# Patient Record
Sex: Female | Born: 1935 | Race: White | Hispanic: No | Marital: Married | Smoking: Former smoker
Health system: Southern US, Community
[De-identification: ages and names within clinical notes are randomized; demographics above are authoritative.]

## PROBLEM LIST (undated history)

## (undated) DIAGNOSIS — E1140 Type 2 diabetes mellitus with diabetic neuropathy, unspecified: Secondary | ICD-10-CM

## (undated) DIAGNOSIS — G629 Polyneuropathy, unspecified: Secondary | ICD-10-CM

## (undated) DIAGNOSIS — I10 Essential (primary) hypertension: Secondary | ICD-10-CM

## (undated) DIAGNOSIS — S98119A Complete traumatic amputation of unspecified great toe, initial encounter: Secondary | ICD-10-CM

## (undated) DIAGNOSIS — J3089 Other allergic rhinitis: Secondary | ICD-10-CM

## (undated) DIAGNOSIS — R1310 Dysphagia, unspecified: Secondary | ICD-10-CM

## (undated) DIAGNOSIS — S98139A Complete traumatic amputation of one unspecified lesser toe, initial encounter: Secondary | ICD-10-CM

## (undated) DIAGNOSIS — R413 Other amnesia: Secondary | ICD-10-CM

## (undated) DIAGNOSIS — E785 Hyperlipidemia, unspecified: Secondary | ICD-10-CM

## (undated) DIAGNOSIS — F0390 Unspecified dementia without behavioral disturbance: Secondary | ICD-10-CM

## (undated) DIAGNOSIS — L089 Local infection of the skin and subcutaneous tissue, unspecified: Secondary | ICD-10-CM

## (undated) DIAGNOSIS — S91301A Unspecified open wound, right foot, initial encounter: Secondary | ICD-10-CM

## (undated) HISTORY — PX: TOTAL KNEE ARTHROPLASTY: SHX125

## (undated) HISTORY — DX: Hyperlipidemia, unspecified: E78.5

## (undated) HISTORY — DX: Local infection of the skin and subcutaneous tissue, unspecified: L08.9

## (undated) HISTORY — DX: Polyneuropathy, unspecified: G62.9

## (undated) HISTORY — PX: ELBOW FRACTURE SURGERY: SHX616

## (undated) HISTORY — DX: Unspecified open wound, right foot, initial encounter: S91.301A

## (undated) HISTORY — PX: APPENDECTOMY: SHX54

## (undated) HISTORY — DX: Other amnesia: R41.3

## (undated) HISTORY — DX: Essential (primary) hypertension: I10

## (undated) HISTORY — DX: Complete traumatic amputation of one unspecified lesser toe, initial encounter: S98.139A

## (undated) HISTORY — DX: Complete traumatic amputation of unspecified great toe, initial encounter: S98.119A

---

## 1997-12-12 ENCOUNTER — Inpatient Hospital Stay (HOSPITAL_COMMUNITY): Admission: RE | Admit: 1997-12-12 | Discharge: 1997-12-16 | Payer: Self-pay | Admitting: Specialist

## 1997-12-19 ENCOUNTER — Encounter (HOSPITAL_COMMUNITY): Admission: RE | Admit: 1997-12-19 | Discharge: 1998-03-19 | Payer: Self-pay | Admitting: Specialist

## 2000-06-27 ENCOUNTER — Encounter: Payer: Self-pay | Admitting: Internal Medicine

## 2000-11-02 ENCOUNTER — Other Ambulatory Visit: Admission: RE | Admit: 2000-11-02 | Discharge: 2000-11-02 | Payer: Self-pay | Admitting: Internal Medicine

## 2003-12-01 ENCOUNTER — Ambulatory Visit (HOSPITAL_COMMUNITY): Admission: RE | Admit: 2003-12-01 | Discharge: 2003-12-01 | Payer: Self-pay | Admitting: Orthopedic Surgery

## 2003-12-02 ENCOUNTER — Inpatient Hospital Stay (HOSPITAL_COMMUNITY): Admission: AD | Admit: 2003-12-02 | Discharge: 2003-12-08 | Payer: Self-pay | Admitting: Orthopedic Surgery

## 2003-12-03 ENCOUNTER — Encounter: Payer: Self-pay | Admitting: Cardiology

## 2003-12-08 ENCOUNTER — Inpatient Hospital Stay: Admission: RE | Admit: 2003-12-08 | Discharge: 2003-12-12 | Payer: Self-pay | Admitting: Internal Medicine

## 2005-01-06 ENCOUNTER — Ambulatory Visit: Payer: Self-pay | Admitting: Internal Medicine

## 2005-01-19 ENCOUNTER — Ambulatory Visit: Payer: Self-pay | Admitting: Internal Medicine

## 2005-04-13 ENCOUNTER — Ambulatory Visit: Payer: Self-pay | Admitting: Internal Medicine

## 2005-08-12 ENCOUNTER — Ambulatory Visit: Payer: Self-pay | Admitting: Internal Medicine

## 2005-08-19 ENCOUNTER — Ambulatory Visit: Payer: Self-pay | Admitting: Internal Medicine

## 2005-11-16 ENCOUNTER — Ambulatory Visit: Payer: Self-pay | Admitting: Internal Medicine

## 2005-11-29 ENCOUNTER — Ambulatory Visit: Payer: Self-pay | Admitting: Internal Medicine

## 2006-01-31 ENCOUNTER — Ambulatory Visit: Payer: Self-pay | Admitting: Internal Medicine

## 2006-05-17 ENCOUNTER — Ambulatory Visit: Payer: Self-pay | Admitting: Internal Medicine

## 2006-05-17 LAB — CONVERTED CEMR LAB
CO2: 26 meq/L (ref 19–32)
GFR calc Af Amer: 56 mL/min
GFR calc non Af Amer: 46 mL/min
Microalb, Ur: 0.7 mg/dL (ref 0.0–1.9)
Potassium: 4.1 meq/L (ref 3.5–5.1)
Sodium: 137 meq/L (ref 135–145)

## 2006-05-24 ENCOUNTER — Ambulatory Visit: Payer: Self-pay | Admitting: Internal Medicine

## 2006-08-22 ENCOUNTER — Encounter: Payer: Self-pay | Admitting: Internal Medicine

## 2006-08-22 DIAGNOSIS — E785 Hyperlipidemia, unspecified: Secondary | ICD-10-CM | POA: Insufficient documentation

## 2006-08-22 DIAGNOSIS — IMO0002 Reserved for concepts with insufficient information to code with codable children: Secondary | ICD-10-CM | POA: Insufficient documentation

## 2006-08-22 DIAGNOSIS — I10 Essential (primary) hypertension: Secondary | ICD-10-CM | POA: Insufficient documentation

## 2006-08-22 DIAGNOSIS — E119 Type 2 diabetes mellitus without complications: Secondary | ICD-10-CM

## 2006-08-22 DIAGNOSIS — G609 Hereditary and idiopathic neuropathy, unspecified: Secondary | ICD-10-CM | POA: Insufficient documentation

## 2006-08-22 DIAGNOSIS — Z96659 Presence of unspecified artificial knee joint: Secondary | ICD-10-CM

## 2006-08-22 DIAGNOSIS — M5030 Other cervical disc degeneration, unspecified cervical region: Secondary | ICD-10-CM

## 2006-09-20 ENCOUNTER — Ambulatory Visit: Payer: Self-pay | Admitting: Internal Medicine

## 2006-09-20 LAB — CONVERTED CEMR LAB
ALT: 31 units/L (ref 0–40)
Direct LDL: 77.8 mg/dL
Triglycerides: 221 mg/dL (ref 0–149)
VLDL: 44 mg/dL — ABNORMAL HIGH (ref 0–40)

## 2006-10-03 ENCOUNTER — Ambulatory Visit: Payer: Self-pay | Admitting: Internal Medicine

## 2007-01-23 ENCOUNTER — Inpatient Hospital Stay (HOSPITAL_COMMUNITY): Admission: EM | Admit: 2007-01-23 | Discharge: 2007-01-25 | Payer: Self-pay | Admitting: Emergency Medicine

## 2007-01-25 ENCOUNTER — Ambulatory Visit: Payer: Self-pay | Admitting: Internal Medicine

## 2007-01-25 ENCOUNTER — Telehealth: Payer: Self-pay | Admitting: Internal Medicine

## 2007-01-31 ENCOUNTER — Ambulatory Visit: Payer: Self-pay | Admitting: Internal Medicine

## 2007-01-31 DIAGNOSIS — L84 Corns and callosities: Secondary | ICD-10-CM

## 2007-01-31 DIAGNOSIS — M21619 Bunion of unspecified foot: Secondary | ICD-10-CM

## 2007-01-31 DIAGNOSIS — L03119 Cellulitis of unspecified part of limb: Secondary | ICD-10-CM

## 2007-01-31 DIAGNOSIS — L02619 Cutaneous abscess of unspecified foot: Secondary | ICD-10-CM

## 2007-02-20 ENCOUNTER — Ambulatory Visit: Payer: Self-pay | Admitting: Internal Medicine

## 2007-03-19 ENCOUNTER — Telehealth (INDEPENDENT_AMBULATORY_CARE_PROVIDER_SITE_OTHER): Payer: Self-pay | Admitting: *Deleted

## 2007-04-12 HISTORY — PX: AMPUTATION: SHX166

## 2007-04-23 ENCOUNTER — Ambulatory Visit: Payer: Self-pay | Admitting: Internal Medicine

## 2007-04-24 ENCOUNTER — Encounter: Payer: Self-pay | Admitting: Internal Medicine

## 2007-04-24 LAB — CONVERTED CEMR LAB
Calcium, Total (PTH): 10 mg/dL (ref 8.4–10.5)
Vit D, 1,25-Dihydroxy: 31 (ref 30–89)

## 2007-04-30 ENCOUNTER — Ambulatory Visit: Payer: Self-pay | Admitting: Internal Medicine

## 2007-08-29 ENCOUNTER — Ambulatory Visit: Payer: Self-pay | Admitting: Internal Medicine

## 2007-09-03 LAB — CONVERTED CEMR LAB
BUN: 19 mg/dL (ref 6–23)
Calcium: 9.8 mg/dL (ref 8.4–10.5)
Creatinine,U: 277.6 mg/dL
GFR calc non Af Amer: 51 mL/min
Glucose, Bld: 109 mg/dL — ABNORMAL HIGH (ref 70–99)
Hgb A1c MFr Bld: 6.4 % — ABNORMAL HIGH (ref 4.6–6.0)
Microalb Creat Ratio: 4.7 mg/g (ref 0.0–30.0)
Potassium: 4.4 meq/L (ref 3.5–5.1)
Sodium: 139 meq/L (ref 135–145)
Total CHOL/HDL Ratio: 3.3

## 2007-09-05 ENCOUNTER — Ambulatory Visit: Payer: Self-pay | Admitting: Internal Medicine

## 2007-09-05 DIAGNOSIS — E1149 Type 2 diabetes mellitus with other diabetic neurological complication: Secondary | ICD-10-CM

## 2007-09-11 ENCOUNTER — Ambulatory Visit: Payer: Self-pay | Admitting: Internal Medicine

## 2007-09-17 LAB — CONVERTED CEMR LAB
Leukocytes, UA: NEGATIVE
Nitrite: NEGATIVE
Specific Gravity, Urine: 1.01 (ref 1.000–1.03)
Urobilinogen, UA: 0.2 (ref 0.0–1.0)

## 2007-10-30 ENCOUNTER — Telehealth (INDEPENDENT_AMBULATORY_CARE_PROVIDER_SITE_OTHER): Payer: Self-pay | Admitting: *Deleted

## 2007-12-26 ENCOUNTER — Ambulatory Visit: Payer: Self-pay | Admitting: Internal Medicine

## 2007-12-26 LAB — CONVERTED CEMR LAB: Hgb A1c MFr Bld: 6.3 % — ABNORMAL HIGH (ref 4.6–6.0)

## 2008-01-02 ENCOUNTER — Ambulatory Visit: Payer: Self-pay | Admitting: Internal Medicine

## 2008-03-20 ENCOUNTER — Telehealth: Payer: Self-pay | Admitting: Internal Medicine

## 2008-03-31 ENCOUNTER — Ambulatory Visit: Payer: Self-pay | Admitting: Internal Medicine

## 2008-03-31 ENCOUNTER — Inpatient Hospital Stay (HOSPITAL_COMMUNITY): Admission: EM | Admit: 2008-03-31 | Discharge: 2008-04-03 | Payer: Self-pay | Admitting: Emergency Medicine

## 2008-04-02 ENCOUNTER — Encounter: Payer: Self-pay | Admitting: Internal Medicine

## 2008-04-08 ENCOUNTER — Encounter: Payer: Self-pay | Admitting: Internal Medicine

## 2008-04-21 ENCOUNTER — Encounter: Payer: Self-pay | Admitting: Internal Medicine

## 2008-04-23 ENCOUNTER — Ambulatory Visit: Payer: Self-pay | Admitting: Internal Medicine

## 2008-05-07 ENCOUNTER — Ambulatory Visit: Payer: Self-pay | Admitting: Internal Medicine

## 2008-05-07 DIAGNOSIS — S98139A Complete traumatic amputation of one unspecified lesser toe, initial encounter: Secondary | ICD-10-CM | POA: Insufficient documentation

## 2008-05-07 HISTORY — DX: Complete traumatic amputation of one unspecified lesser toe, initial encounter: S98.139A

## 2008-06-17 ENCOUNTER — Ambulatory Visit: Payer: Self-pay | Admitting: Internal Medicine

## 2008-06-17 ENCOUNTER — Telehealth: Payer: Self-pay | Admitting: Internal Medicine

## 2008-06-17 ENCOUNTER — Inpatient Hospital Stay (HOSPITAL_COMMUNITY): Admission: AD | Admit: 2008-06-17 | Discharge: 2008-06-23 | Payer: Self-pay | Admitting: Internal Medicine

## 2008-06-17 DIAGNOSIS — E1169 Type 2 diabetes mellitus with other specified complication: Secondary | ICD-10-CM | POA: Insufficient documentation

## 2008-06-18 ENCOUNTER — Encounter (INDEPENDENT_AMBULATORY_CARE_PROVIDER_SITE_OTHER): Payer: Self-pay | Admitting: Orthopedic Surgery

## 2008-06-18 ENCOUNTER — Ambulatory Visit: Payer: Self-pay | Admitting: Vascular Surgery

## 2008-06-20 ENCOUNTER — Encounter (INDEPENDENT_AMBULATORY_CARE_PROVIDER_SITE_OTHER): Payer: Self-pay | Admitting: Orthopedic Surgery

## 2008-06-23 ENCOUNTER — Encounter: Payer: Self-pay | Admitting: Internal Medicine

## 2008-06-23 HISTORY — PX: AMPUTATION: SHX166

## 2008-06-30 ENCOUNTER — Ambulatory Visit: Payer: Self-pay | Admitting: Internal Medicine

## 2008-06-30 DIAGNOSIS — S98119A Complete traumatic amputation of unspecified great toe, initial encounter: Secondary | ICD-10-CM

## 2008-06-30 DIAGNOSIS — D649 Anemia, unspecified: Secondary | ICD-10-CM | POA: Insufficient documentation

## 2008-06-30 HISTORY — DX: Complete traumatic amputation of unspecified great toe, initial encounter: S98.119A

## 2008-07-24 ENCOUNTER — Encounter: Payer: Self-pay | Admitting: Internal Medicine

## 2008-07-30 ENCOUNTER — Ambulatory Visit: Payer: Self-pay | Admitting: Internal Medicine

## 2008-07-30 LAB — CONVERTED CEMR LAB
Basophils Absolute: 0 10*3/uL (ref 0.0–0.1)
Basophils Relative: 0.7 % (ref 0.0–3.0)
Calcium: 9.2 mg/dL (ref 8.4–10.5)
Chloride: 105 meq/L (ref 96–112)
Creatinine, Ser: 0.8 mg/dL (ref 0.4–1.2)
GFR calc non Af Amer: 73.54 mL/min (ref >60)
Hemoglobin: 12.1 g/dL (ref 12.0–15.0)
Hgb A1c MFr Bld: 6.6 % — ABNORMAL HIGH (ref 4.6–6.5)
Lymphocytes Relative: 36.1 % (ref 12.0–46.0)
MCV: 90.3 fL (ref 78.0–100.0)
Monocytes Absolute: 0.3 10*3/uL (ref 0.1–1.0)
Monocytes Relative: 5.7 % (ref 3.0–12.0)
Neutro Abs: 3.3 10*3/uL (ref 1.4–7.7)
Neutrophils Relative %: 55.7 % (ref 43.0–77.0)
RDW: 14.7 % — ABNORMAL HIGH (ref 11.5–14.6)
WBC: 5.8 10*3/uL (ref 4.5–10.5)

## 2008-08-06 ENCOUNTER — Ambulatory Visit: Payer: Self-pay | Admitting: Internal Medicine

## 2008-10-28 ENCOUNTER — Ambulatory Visit: Payer: Self-pay | Admitting: Internal Medicine

## 2008-10-28 LAB — CONVERTED CEMR LAB
BUN: 21 mg/dL (ref 6–23)
Chloride: 106 meq/L (ref 96–112)
Cholesterol: 138 mg/dL (ref 0–200)
Creatinine, Ser: 1 mg/dL (ref 0.4–1.2)
LDL Cholesterol: 67 mg/dL (ref 0–99)
Total CHOL/HDL Ratio: 3

## 2008-12-02 ENCOUNTER — Ambulatory Visit: Payer: Self-pay | Admitting: Internal Medicine

## 2008-12-02 DIAGNOSIS — R5381 Other malaise: Secondary | ICD-10-CM | POA: Insufficient documentation

## 2008-12-02 DIAGNOSIS — R5383 Other fatigue: Secondary | ICD-10-CM

## 2008-12-02 LAB — CONVERTED CEMR LAB: Blood Glucose, Fingerstick: 220

## 2008-12-10 LAB — HM DIABETES EYE EXAM: HM Diabetic Eye Exam: NORMAL

## 2009-02-23 ENCOUNTER — Ambulatory Visit: Payer: Self-pay | Admitting: Internal Medicine

## 2009-02-23 LAB — CONVERTED CEMR LAB: Hgb A1c MFr Bld: 6.2 % (ref 4.6–6.5)

## 2009-03-02 ENCOUNTER — Ambulatory Visit: Payer: Self-pay | Admitting: Internal Medicine

## 2009-03-03 LAB — CONVERTED CEMR LAB
Creatinine,U: 174.8 mg/dL
Microalb Creat Ratio: 3.4 mg/g (ref 0.0–30.0)
Microalb, Ur: 0.6 mg/dL (ref 0.0–1.9)

## 2009-05-23 IMAGING — CR DG FOOT COMPLETE 3+V*R*
3 series · 3 of 3 positions shown · non-contrast
Comparison: MRI 01/24/2007.

CLINICAL DATA: Infected sore right big toe.

RIGHT FOOT COMPLETE - 3+ VIEW

[view not recorded (1 of 3)]
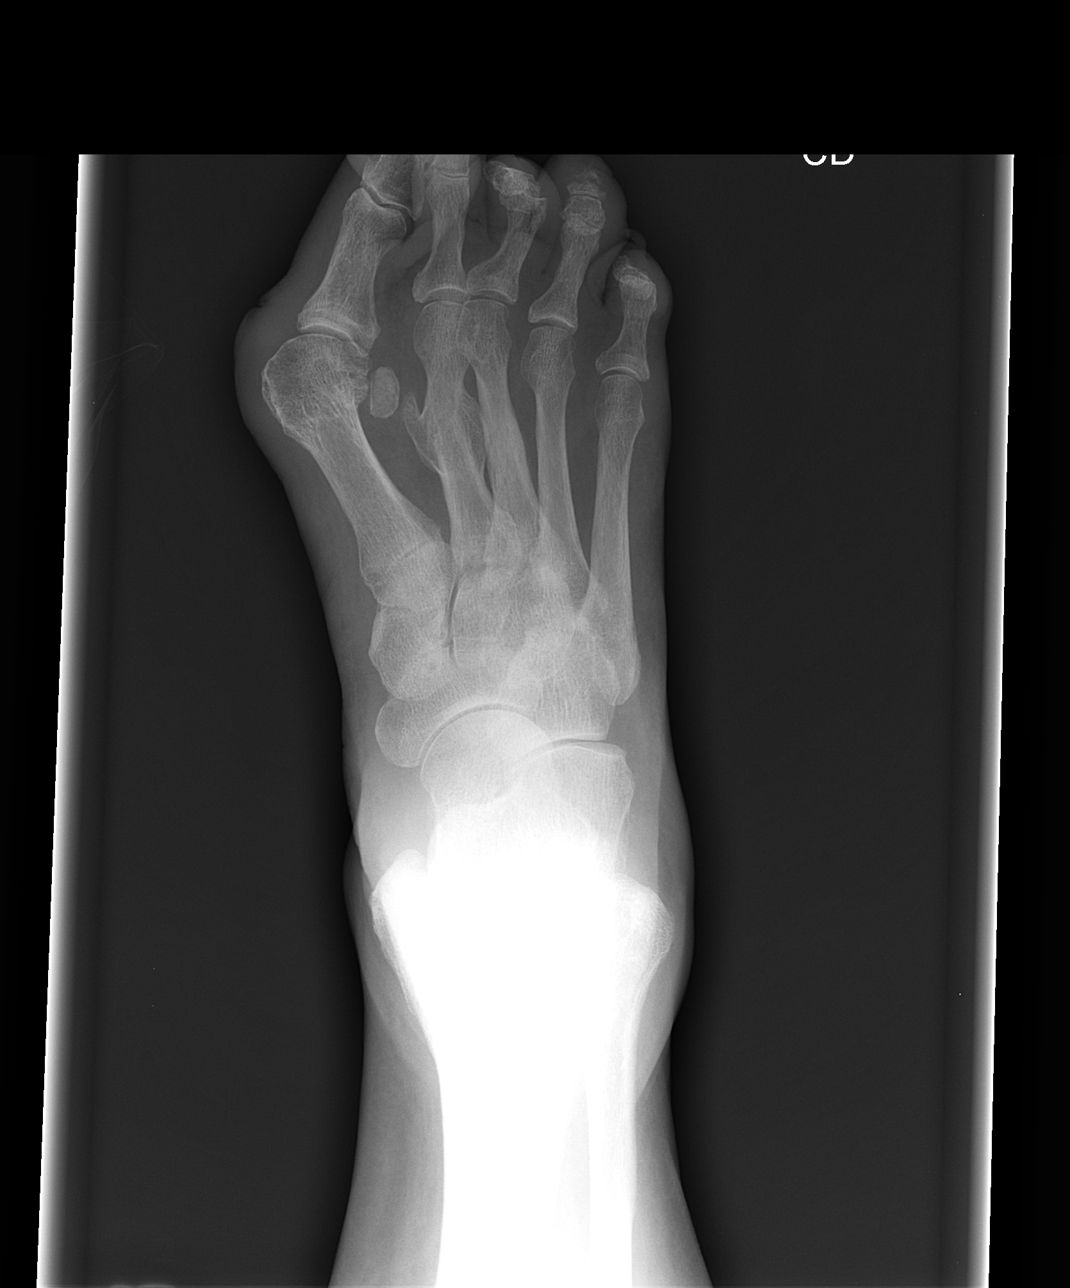

[view not recorded (2 of 3)]
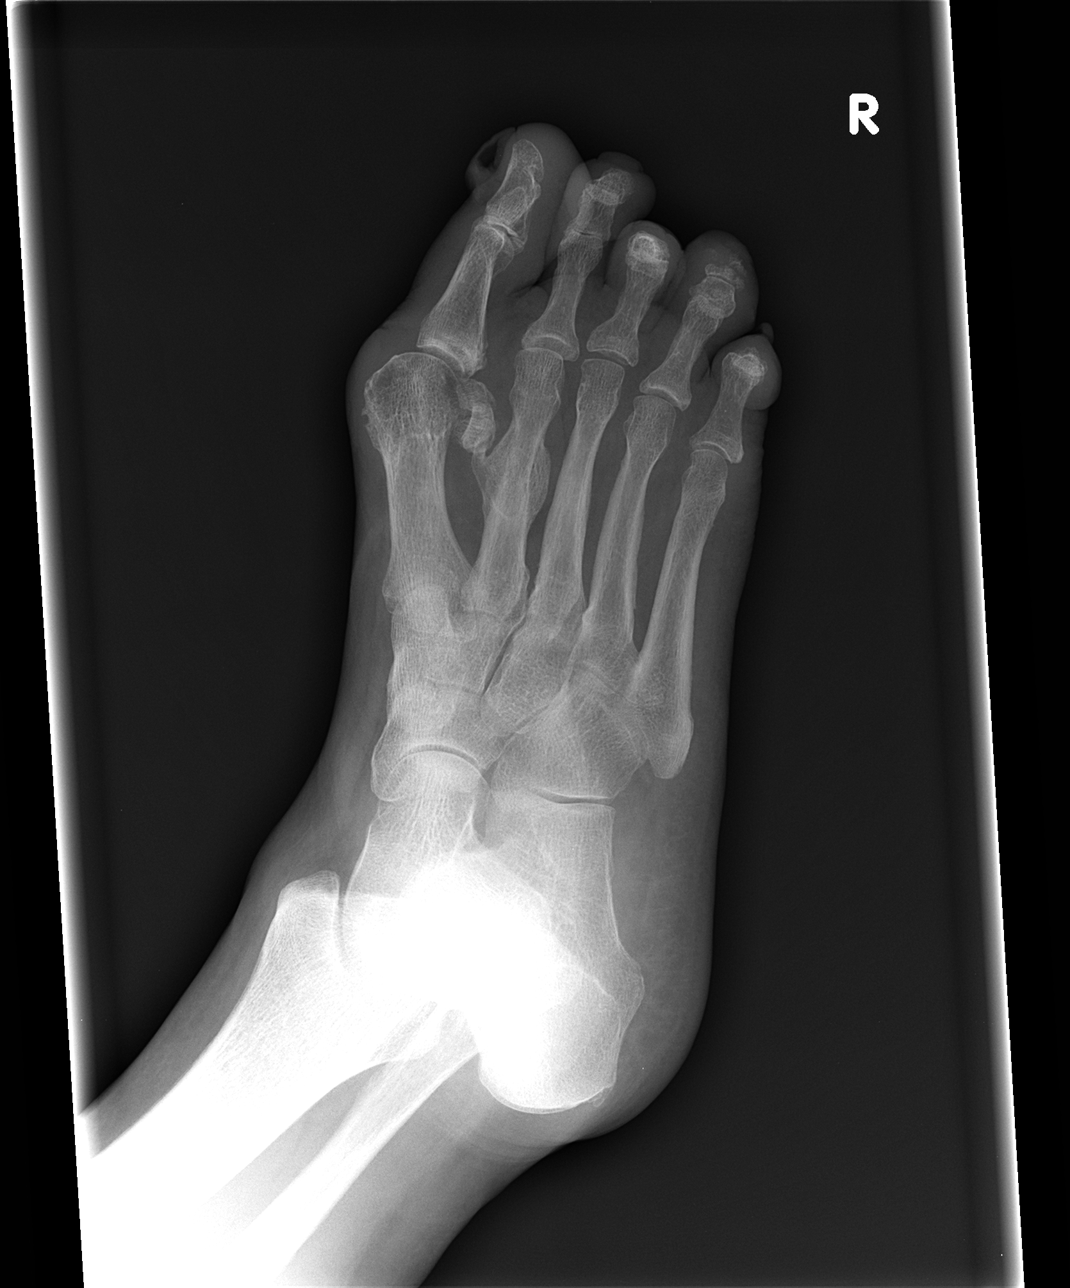

[view not recorded (3 of 3)]
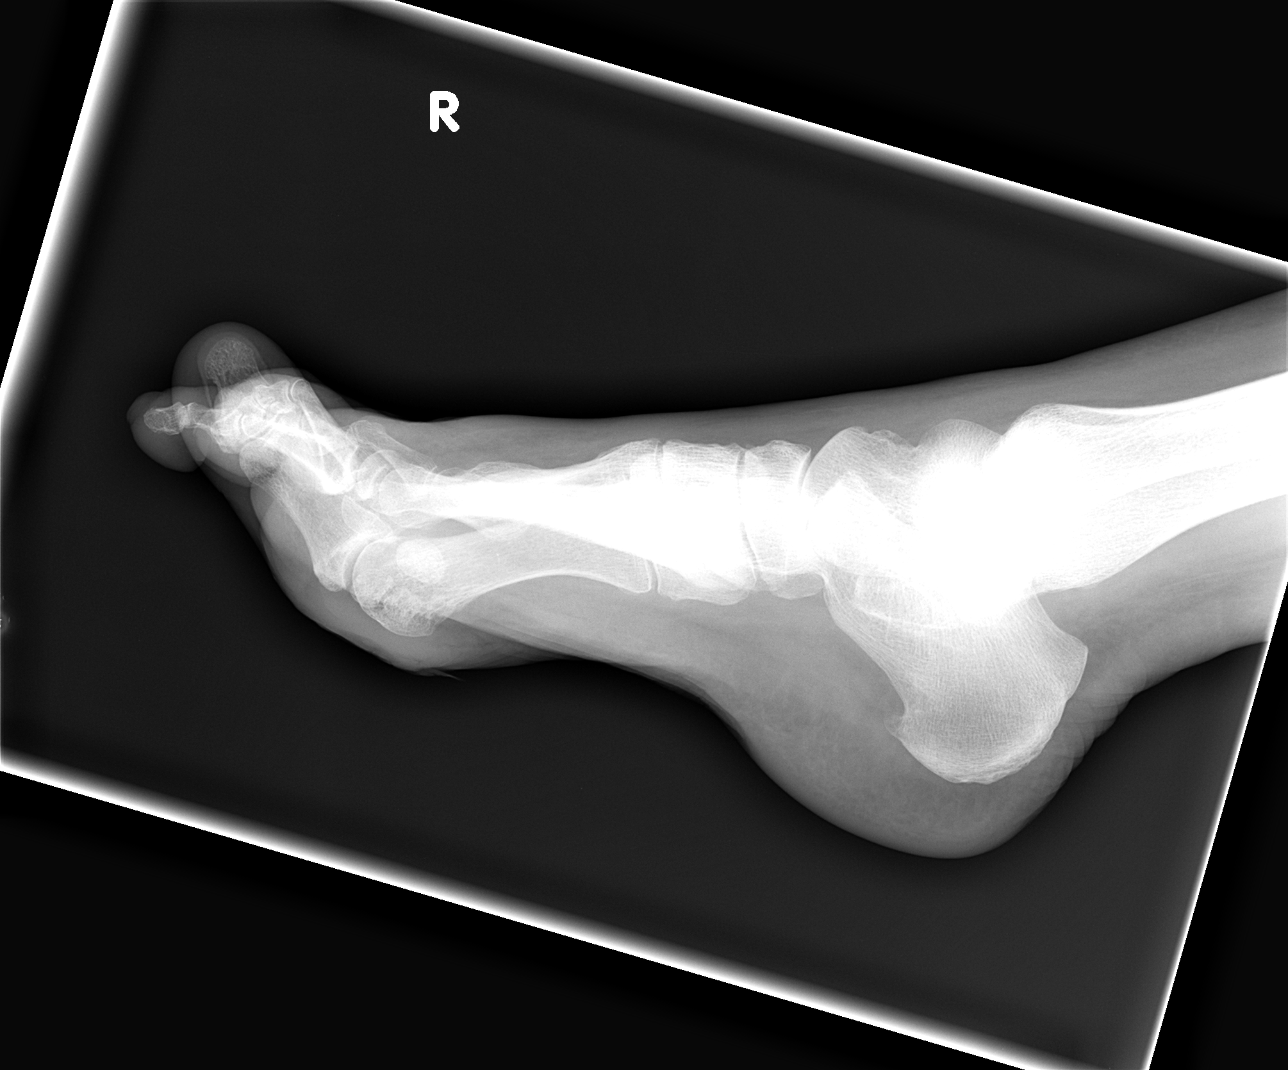

[3 of 3 positions shown; findings below may reference images not displayed]

FINDINGS: A severe hallux valgus deformity is present.  There is
irregular callus formation about the midbody of the second
metatarsal compatible with remote healed fracture.  There is soft
tissue swelling with ulceration at the level of the first MTP
joint.  There is no underlying bone destruction.  There does appear
be some bone destruction in the distal phalanx of the fourth toe.
There may be some soft tissue swelling there is well.  No other
acute osseous abnormality is seen.
IMPRESSION: 1.  Severe hallux valgus deformity with rotation at the MTP joint.
2.  Soft tissue swelling and probable ulceration without definite
osseous destruction.
3.  Apparent bone destruction in the distal phalanx of the fourth
toe.  There was no focal process involving this toe on the previous
MRI scan.

## 2009-06-17 ENCOUNTER — Ambulatory Visit: Payer: Self-pay | Admitting: Internal Medicine

## 2009-06-17 LAB — CONVERTED CEMR LAB: Hgb A1c MFr Bld: 6.4 % (ref 4.6–6.5)

## 2009-06-30 ENCOUNTER — Ambulatory Visit: Payer: Self-pay | Admitting: Internal Medicine

## 2009-08-20 ENCOUNTER — Encounter: Payer: Self-pay | Admitting: Internal Medicine

## 2009-09-16 ENCOUNTER — Ambulatory Visit: Payer: Self-pay | Admitting: Internal Medicine

## 2009-11-18 ENCOUNTER — Ambulatory Visit: Payer: Self-pay | Admitting: Internal Medicine

## 2009-11-18 LAB — CONVERTED CEMR LAB
AST: 20 units/L (ref 0–37)
BUN: 22 mg/dL (ref 6–23)
Bilirubin, Direct: 0.2 mg/dL (ref 0.0–0.3)
CO2: 26 meq/L (ref 19–32)
Chloride: 106 meq/L (ref 96–112)
Eosinophils Relative: 1 % (ref 0.0–5.0)
GFR calc non Af Amer: 52.4 mL/min (ref >60)
Glucose, Bld: 136 mg/dL — ABNORMAL HIGH (ref 70–99)
HCT: 39.8 % (ref 36.0–46.0)
Lymphocytes Relative: 21.5 % (ref 12.0–46.0)
MCV: 93.4 fL (ref 78.0–100.0)
Monocytes Relative: 3.9 % (ref 3.0–12.0)
Neutro Abs: 5.3 10*3/uL (ref 1.4–7.7)
Neutrophils Relative %: 73.2 % (ref 43.0–77.0)
Platelets: 254 10*3/uL (ref 150.0–400.0)
Potassium: 4.9 meq/L (ref 3.5–5.1)
RBC: 4.26 M/uL (ref 3.87–5.11)
TSH: 1.34 microintl units/mL (ref 0.35–5.50)
Total CHOL/HDL Ratio: 3
Total Protein: 7.1 g/dL (ref 6.0–8.3)
Triglycerides: 150 mg/dL — ABNORMAL HIGH (ref 0.0–149.0)
VLDL: 30 mg/dL (ref 0.0–40.0)
Vitamin B-12: 545 pg/mL (ref 211–911)

## 2009-11-26 ENCOUNTER — Ambulatory Visit: Payer: Self-pay | Admitting: Internal Medicine

## 2009-11-26 DIAGNOSIS — R269 Unspecified abnormalities of gait and mobility: Secondary | ICD-10-CM | POA: Insufficient documentation

## 2010-01-12 ENCOUNTER — Encounter: Payer: Self-pay | Admitting: Internal Medicine

## 2010-01-13 ENCOUNTER — Encounter: Payer: Self-pay | Admitting: Internal Medicine

## 2010-01-28 ENCOUNTER — Ambulatory Visit: Payer: Self-pay | Admitting: Internal Medicine

## 2010-02-02 ENCOUNTER — Encounter: Payer: Self-pay | Admitting: Internal Medicine

## 2010-03-15 ENCOUNTER — Encounter: Payer: Self-pay | Admitting: Internal Medicine

## 2010-03-18 ENCOUNTER — Ambulatory Visit: Payer: Self-pay | Admitting: Internal Medicine

## 2010-04-09 ENCOUNTER — Ambulatory Visit: Payer: Self-pay | Admitting: Internal Medicine

## 2010-04-09 DIAGNOSIS — L089 Local infection of the skin and subcutaneous tissue, unspecified: Secondary | ICD-10-CM | POA: Insufficient documentation

## 2010-04-09 DIAGNOSIS — L97509 Non-pressure chronic ulcer of other part of unspecified foot with unspecified severity: Secondary | ICD-10-CM

## 2010-04-11 HISTORY — PX: TOE AMPUTATION: SHX809

## 2010-04-13 ENCOUNTER — Encounter: Payer: Self-pay | Admitting: Internal Medicine

## 2010-04-14 ENCOUNTER — Encounter: Payer: Self-pay | Admitting: Internal Medicine

## 2010-04-14 ENCOUNTER — Encounter: Payer: Self-pay | Admitting: *Deleted

## 2010-05-11 NOTE — Letter (Signed)
Summary: [facility]  [facility]   Imported By: Maryln Gottron 02/01/2010 15:56:45  _____________________________________________________________________  External Attachment:    Type:   Image     Comment:   External Document

## 2010-05-11 NOTE — Letter (Signed)
Summary: Request for Surgical Clearance/[facility]  Request for Surgical Clearance/[facility]   Imported By: Maryln Gottron 02/01/2010 15:55:47  _____________________________________________________________________  External Attachment:    Type:   Image     Comment:   External Document

## 2010-05-11 NOTE — Letter (Signed)
Summary: [facility]  [facility]   Imported By: Maryln Gottron 08/28/2009 10:55:53  _____________________________________________________________________  External Attachment:    Type:   Image     Comment:   External Document

## 2010-05-11 NOTE — Assessment & Plan Note (Signed)
Summary: FORM COMPLETION (VETERAN BENEFIT FORM) // RS   Vital Signs:  Patient profile:   77 year old female Menstrual status:  postmenopausal Weight:      159 pounds Pulse rate:   100 / minute BP sitting:   110 / 60  (left arm) Cuff size:   regular  Vitals Entered By: Romualdo Bolk, CMA (AAMA) (September 16, 2009 2:55 PM) CC: Form Completion   History of Present Illness: Natasha West comes in today  for return office visit .for number of medical problems.  She also has a form for documentation  of her functional ability and limitations form the [place] .  She has seen neurology for  blance problem and dx as perpheral neuropathy  .   No ingervention except physical supports  although considering PT if needed.   Risk of falling  but has had " no serious falls"and now using  walker  roller seat. Dos quite well with this. Blood sugars up and down.    no lows BP doing well. No new infections vision change or cp sob.   Preventive Screening-Counseling & Management  Alcohol-Tobacco     Alcohol drinks/day: 0     Smoking Status: quit     Year Quit: 25 years ago  Caffeine-Diet-Exercise     Caffeine use/day: 1-2 a week     Diet Counseling: to improve diet; diet is suboptimal     Nutrition Referrals: no     Does Patient Exercise: no  Current Medications (verified): 1)  Amaryl 4 Mg Tabs (Glimepiride) .... Take 1 Tablet By Mouth Twice A Day 2)  Aspir-81 81 Mg Tbec (Aspirin) .... Take 1 Tablet By Mouth Every Morning 3)  Hydrocodone-Acetaminophen 5-500 Mg Tabs (Hydrocodone-Acetaminophen) 4)  Lipitor 10 Mg Tabs (Atorvastatin Calcium) .... Take 1 Tablet By Mouth Once A Day 5)  Lisinopril-Hydrochlorothiazide 20-12.5 Mg Tabs (Lisinopril-Hydrochlorothiazide) .... Take 1 Tablet By Mouth Once A Day 6)  Metformin Hcl 500 Mg Tb24 (Metformin Hcl) .... Take 1 Two Times A Day 7)  Sm Calcium/vitamin D 500-200 Mg-Unit Tabs (Calcium Carbonate-Vitamin D) .... Take 2 Tablet By Mouth Once A Day 8)  Vitamin C  1000 Mg Tabs (Ascorbic Acid) .... Take 9)  Vitamin B-12 Cr 1500 Mcg  Tbcr (Cyanocobalamin) .[name].. 1 By Mouth Once Daily  Allergies (verified): 1)  Penicillin  Past History:  Past medical, surgical, family and social histories (including risk factors) reviewed, and no changes noted (except as noted below).  Past Medical History: Reviewed history from 05/07/2008 and no changes required. Diabetes mellitus, type II with neuropathy Hyperlipidemia Hypertension Peripheral neuropathy      Past Surgical History: Reviewed history from 06/30/2008 and no changes required. 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 Charlsie Merles Neurology: [facility] Surgery: Dr. Luiz Blare  Family History: Reviewed history from 06/30/2009 and no changes required. [place] Family History Diabetes 1st degree relative daughter recently dxed  Mom: allzheiners father cva endarterectomy  paternal aunt ovarian ca  paternal uncles heart and stroke       Social History: Reviewed history from 06/17/2008 and no changes required. Former Smoker Married husband has vision problem and recurrent falls   moved to retirement place   [employer].   2010    closer to family.  house still not sold  Review of Systems  The patient denies anorexia, fever, weight gain, vision loss, decreased hearing, hoarseness, chest pain, syncope, dyspnea on exertion, peripheral edema, prolonged cough, abdominal pain, melena, hematochezia, severe indigestion/heartburn, hematuria, transient blindness, depression, abnormal bleeding, enlarged lymph nodes, and angioedema.         somem unintentional weight loss because of  food plan but doing well  not from illness, nl bowel and bladder function  Physical Exam  General:  alert, well-developed,  and well-nourished.   in nad  requiring roller walker  to maneuver  is very unsteady without holding on  even at rest. Head:  normocephalic and atraumatic.   Eyes:  vision grossly intact and pupils equal.   Ears:  R ear normal and L ear normal.   Neck:  No deformities, masses, or tenderness noted. no brits hear Lungs:  Normal respiratory effort, chest expands symmetrically. Lungs are clear to auscultation, no crackles or wheezes. Heart:  Normal rate and regular rhythm. S1 and S2 normal without gallop, murmur, click, rub or other extra sounds. Pulses:  pulses intact without delay  ue  Extremities:  nl capa refill  Neurologic:  UE grip good left elbow sithout full extension  but good rom otherwise gait  is abnormal with ? paretic gait on right  aiwth limp and usnteady unless with assistance   no cogwheeling   a and oriented x 3  Skin:  turgor normal, color normal, no ecchymoses, no petechiae, and no purpura.   Cervical Nodes:  No lymphadenopathy noted Psych:  Oriented X3, normally interactive, good eye contact, not anxious appearing, and not depressed appearing.     Impression & Recommendations:  Problem # 1:  PERIPHERAL NEUROPATHY (ICD-356.9) problematic but no falling at present . see neuro consult.  needs rollerwalker to  be mobile .   Problem # 2:  DIABETES MELLITUS, TYPE II, WITH NEUROLOGICAL COMPLICATIONS (ICD-250.60)  Her updated medication list for this problem includes:    Amaryl 4 Mg Tabs (Glimepiride) .[name]... Take 1 tablet by mouth twice a day    Aspir-81 81 Mg Tbec (Aspirin) .[name]... Take 1 tablet by mouth every morning    Lisinopril-hydrochlorothiazide 20-12.5 Mg Tabs (Lisinopril-hydrochlorothiazide) .[name]... Take 1 tablet by mouth once a day    Metformin Hcl 500 Mg Tb24 (Metformin hcl) .[name]... Take 1 two times a day  Labs Reviewed: Creat: 1.0 (10/28/2008)     Last Eye Exam: normal (12/10/2008) Reviewed HgBA1c results: 6.4 (06/17/2009)  6.2 (02/23/2009)  Problem # 3:   HYPERTENSION (ICD-401.9)  Her updated medication list for this problem includes:    Lisinopril-hydrochlorothiazide 20-12.5 Mg Tabs (Lisinopril-hydrochlorothiazide) .[name]... Take 1 tablet by mouth once a day  Problem # 4:  HYPERLIPIDEMIA (ICD-272.4)  Her updated medication list for this problem includes:    Lipitor 10 Mg Tabs (Atorvastatin calcium) .[name]... Take 1 tablet by mouth once a day  Problem # 5:  DEGENERATION, DISC NOS (ICD-722.6) Assessment: Comment Only  Complete Medication List: 1)  Amaryl 4 Mg Tabs (Glimepiride) .... Take 1 tablet by mouth twice a day 2)  Aspir-81 81 Mg Tbec (Aspirin) .... Take 1 tablet by mouth every morning 3)  Hydrocodone-acetaminophen 5-500 Mg Tabs (Hydrocodone-acetaminophen) 4)  Lipitor 10 Mg Tabs (Atorvastatin calcium) .... Take 1 tablet by mouth once a day 5)  Lisinopril-hydrochlorothiazide 20-12.5 Mg Tabs (Lisinopril-hydrochlorothiazide) .... Take 1 tablet by mouth once a day 6)  Metformin Hcl 500 Mg Tb24 (Metformin hcl) .... Take 1 two times a day 7)  Sm Calcium/vitamin D  500-200 Mg-unit Tabs (Calcium carbonate-vitamin d) .... Take 2 tablet by mouth once a day 8)  Vitamin C 1000 Mg Tabs (Ascorbic acid) .... Take 9)  Vitamin B-12 Cr 1500 Mcg Tbcr (Cyanocobalamin) .[name].. 1 by mouth once daily  Patient Instructions: 1)  keep lab and follow up appt in the summer. 2)  consider  calling Dr Anne Hahn and see if physical therapy may help your balance .

## 2010-05-11 NOTE — Letter (Signed)
Summary: Surgery Request  Surgery Request   Imported By: Georgian Co 02/02/2010 12:20:49  _____________________________________________________________________  External Attachment:    Type:   Image     Comment:   External Document

## 2010-05-11 NOTE — Consult Note (Signed)
Summary: [facility]   Imported By: Maryln Gottron 03/18/2010 12:25:48  _____________________________________________________________________  External Attachment:    Type:   Image     Comment:   External Document

## 2010-05-11 NOTE — Assessment & Plan Note (Signed)
Summary: 4 MONTH ROA//LH   Vital Signs:  Patient profile:   77 year old female Menstrual status:  postmenopausal Height:      66 inches Weight:      162 pounds BMI:     26.24 Pulse rate:   120 / minute BP sitting:   110 / 60  (right arm) Cuff size:   regular  Vitals Entered By: Romualdo Bolk, CMA (AAMA) (June 30, 2009 10:58 AM) CC: Follow-up visit on labs, Hypertension Management   History of Present Illness: Natasha West comesin for follow up of multiple medical problems  Since last visit  here  there have been no major changes in health status  . DM:   no change and ok no lows .   vision no change .. feet  no change  uses  padding . right foot .  Neuropathy   seems to have more balance problem that could be from inactivity over the winter . No falls able to drive.  Has appt wth neurology in the next weeks.  No cp sob . BP : doing well  LIPIDS: no problem with meds   Hypertension History:      She denies headache, chest pain, palpitations, dyspnea with exertion, orthopnea, PND, peripheral edema, visual symptoms, neurologic problems, syncope, and side effects from treatment.  She notes no problems with any antihypertensive medication side effects.        Positive major cardiovascular risk factors include female age 45 years old or older, diabetes, hyperlipidemia, and hypertension.  Negative major cardiovascular risk factors include non-tobacco-user status.      Preventive Screening-Counseling & Management  Alcohol-Tobacco     Alcohol drinks/day: 0     Smoking Status: quit     Year Quit: 25 years ago  Caffeine-Diet-Exercise     Caffeine use/day: 1-2 a week     Diet Counseling: to improve diet; diet is suboptimal     Nutrition Referrals: no     Does Patient Exercise: no  Current Medications (verified): 1)  Amaryl 4 Mg Tabs (Glimepiride) .... Take 1 Tablet By Mouth Twice A Day 2)  Aspir-81 81 Mg Tbec (Aspirin) .... Take 1 Tablet By Mouth Every Morning 3)   Hydrocodone-Acetaminophen 5-500 Mg Tabs (Hydrocodone-Acetaminophen) 4)  Lipitor 10 Mg Tabs (Atorvastatin Calcium) .... Take 1 Tablet By Mouth Once A Day 5)  Lisinopril-Hydrochlorothiazide 20-12.5 Mg Tabs (Lisinopril-Hydrochlorothiazide) .... Take 1 Tablet By Mouth Once A Day 6)  Metformin Hcl 500 Mg Tb24 (Metformin Hcl) .... Take 1 Two Times A Day 7)  Sm Calcium/vitamin D 500-200 Mg-Unit Tabs (Calcium Carbonate-Vitamin D) .... Take 2 Tablet By Mouth Once A Day 8)  Vitamin C 1000 Mg Tabs (Ascorbic Acid) .... Take 9)  Vitamin B-12 Cr 1500 Mcg  Tbcr (Cyanocobalamin) .[name].. 1 By Mouth Once Daily  Allergies (verified): 1)  Penicillin  Past History:  Past medical, surgical, family and social histories (including risk factors) reviewed, and no changes noted (except as noted below).  Past Medical History: Reviewed history from 05/07/2008 and no changes required. Diabetes mellitus, type II with neuropathy Hyperlipidemia Hypertension Peripheral neuropathy      Past Surgical History: Reviewed history from 06/30/2008 and no changes required. 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 Charlsie Merles Neurology: [facility] Surgery: Dr. Luiz Blare  Family  History: Reviewed history from 06/17/2008 and no changes required. [place] Family History Diabetes 1st degree relative daughter recently dxed  Mom: allzheiners father cva endarterectomy  paternal aunt ovarian ca  paternal uncles heart and stroke       Social History: Reviewed history from 06/17/2008 and no changes required. Former Smoker Married husband has vision problem and recurrent falls   moved to retirement place   [employer].      closer to family.  Review of Systems  The patient denies anorexia, fever, weight loss, decreased  hearing, prolonged cough, abdominal pain, melena, hematochezia, severe indigestion/heartburn, hematuria, suspicious skin lesions, transient blindness, abnormal bleeding, enlarged lymph nodes, and angioedema.    Physical Exam  General:  alert, well-developed, well-nourished, well-hydrated, appropriate dress, and normal appearance.   Head:  normocephalic and no abnormalities observed.   Ears:  R ear normal and L ear normal.   Neck:  No deformities, masses, or tenderness noted. Lungs:  Normal respiratory effort, chest expands symmetrically. Lungs are clear to auscultation, no crackles or wheezes.no dullness.   Heart:  Normal rate and regular rhythm. S1 and S2 normal without gallop, murmur, click, rub or other extra sounds.no lifts.   Msk:  partial amputation right foot toes  Pulses:  nl cap refill  Extremities:  no cce   Diabetes Management Exam:    Foot Exam (with socks and/or shoes not present):       Sensory-Monofilament:          Left foot: diminished          Right foot: absent       Inspection:          Left foot: normal          Right foot: abnormal             Comments: partial amputaion  curved mails  hammer  middle toe     Eye Exam:       Eye Exam done elsewhere          Date: 12/10/2008          Results: normal          Done by: Posey Rea of name   Impression & Recommendations:  Problem # 1:  DIABETES MELLITUS, TYPE II, WITH NEUROLOGICAL COMPLICATIONS (ICD-250.60) controlled     hg a1c 6.4     Her updated medication list for this problem includes:    Amaryl 4 Mg Tabs (Glimepiride) .[name]... Take 1 tablet by mouth twice a day    Aspir-81 81 Mg Tbec (Aspirin) .[name]... Take 1 tablet by mouth every morning    Lisinopril-hydrochlorothiazide 20-12.5 Mg Tabs (Lisinopril-hydrochlorothiazide) .[name]... Take 1 tablet by mouth once a day    Metformin Hcl 500 Mg Tb24 (Metformin hcl) .[name]... Take 1 two times a day  Problem # 2:  ANEMIA (ICD-285.9) Assessment: Improved  Her updated medication  list for this problem includes:    Vitamin B-12 Cr 1500 Mcg Tbcr (Cyanocobalamin) .[name]... 1 by mouth once daily  Problem # 3:  PERIPHERAL NEUROPATHY (ICD-356.9) ? bothering her balance      Problem # 4:  HYPERTENSION (ICD-401.9) Assessment: Unchanged  Her updated medication list for this problem includes:    Lisinopril-hydrochlorothiazide 20-12.5 Mg Tabs (Lisinopril-hydrochlorothiazide) .[name]... Take 1 tablet by mouth once a day  Problem # 5:  HYPERLIPIDEMIA (ICD-272.4)  Her updated medication list for this problem includes:    Lipitor 10 Mg Tabs (Atorvastatin calcium) .[name]... Take 1 tablet by mouth once  a day  Labs Reviewed: SGOT: 25 (09/20/2006)   SGPT: 31 (09/20/2006)  10 Yr Risk Heart Disease: 9 % Prior 10 Yr Risk Heart Disease: 15 % (12/02/2008)   HDL:45.60 (10/28/2008), 41.0 (08/29/2007)  LDL:67 (10/28/2008), 58 (08/29/2007)  Chol:138 (10/28/2008), 134 (08/29/2007)  Trig:129.0 (10/28/2008), 175 (08/29/2007)  Complete Medication List: 1)  Amaryl 4 Mg Tabs (Glimepiride) .... Take 1 tablet by mouth twice a day 2)  Aspir-81 81 Mg Tbec (Aspirin) .... Take 1 tablet by mouth every morning 3)  Hydrocodone-acetaminophen 5-500 Mg Tabs (Hydrocodone-acetaminophen) 4)  Lipitor 10 Mg Tabs (Atorvastatin calcium) .... Take 1 tablet by mouth once a day 5)  Lisinopril-hydrochlorothiazide 20-12.5 Mg Tabs (Lisinopril-hydrochlorothiazide) .... Take 1 tablet by mouth once a day 6)  Metformin Hcl 500 Mg Tb24 (Metformin hcl) .... Take 1 two times a day 7)  Sm Calcium/vitamin D 500-200 Mg-unit Tabs (Calcium carbonate-vitamin d) .... Take 2 tablet by mouth once a day 8)  Vitamin C 1000 Mg Tabs (Ascorbic acid) .... Take 9)  Vitamin B-12 Cr 1500 Mcg Tbcr (Cyanocobalamin) .[name].. 1 by mouth once daily  Hypertension Assessment/Plan:      The patient's hypertensive risk group is category C: Target organ damage and/or diabetes.  Her calculated 10 year risk of coronary heart disease is 9 %.  Today's blood pressure is  110/60.  Her blood pressure goal is < 130/80.  Patient Instructions: 1)  continue BG control. 2)  Agree with neurology reevaluation  and address the  balance problem.   3)  return office visit in 4 months  .   Hg a1c B12 and folic acid , TSH,  CBC diff  Lipids lfts.   dx  elevated lipids , dm neuropathy.

## 2010-05-11 NOTE — Letter (Signed)
Summary: Examination for [facility]  Examination for [facility]   Imported By: Maryln Gottron 09/21/2009 15:43:45  _____________________________________________________________________  External Attachment:    Type:   Image     Comment:   External Document

## 2010-05-11 NOTE — Assessment & Plan Note (Signed)
Summary: 4 month rov/njr   Vital Signs:  Patient profile:   77 year old female Menstrual status:  postmenopausal Weight:      160 pounds Pulse rate:   78 / minute BP sitting:   100 / 60  (right arm) Cuff size:   regular  Vitals Entered By: Romualdo Bolk, CMA (AAMA) (November 26, 2009 10:02 AM)  Serial Vital Signs/Assessments:  Time      Position  BP       Pulse  Resp  Temp     By                     110/78                         Madelin Headings MD  CC: Follow-up visit on labs, Hypertension Management   History of Present Illness: Natasha West comes in today  with daughter for follow up of multiple medical problems  Since last visit  here  there have been no major changes in health status  . Dm no change in vision .[name]  no lows but gets shaky if skips meal . HT: no se of meds  no syncope LIPIDs:    no se of meds  Foot  " doing ok " balance   not good but no falls    uses walker.   cant drive with foot problem  Hypertension History:      She complains of neurologic problems and syncope, but denies headache, chest pain, palpitations, dyspnea with exertion, orthopnea, PND, peripheral edema, visual symptoms, and side effects from treatment.  She notes no problems with any antihypertensive medication side effects.  numbness in hands and some dizziness.        Positive major cardiovascular risk factors include female age 70 years old or older, diabetes, hyperlipidemia, and hypertension.  Negative major cardiovascular risk factors include non-tobacco-user status.      Preventive Screening-Counseling & Management  Alcohol-Tobacco     Alcohol drinks/day: 0     Smoking Status: quit     Year Quit: 25 years ago  Caffeine-Diet-Exercise     Caffeine use/day: 1-2 a week     Diet Counseling: to improve diet; diet is suboptimal     Nutrition Referrals: no     Does Patient Exercise: no  Current Medications (verified): 1)  Amaryl 4 Mg Tabs (Glimepiride) .... Take 1 Tablet By Mouth  Twice A Day 2)  Aspir-81 81 Mg Tbec (Aspirin) .... Take 1 Tablet By Mouth Every Morning 3)  Hydrocodone-Acetaminophen 5-500 Mg Tabs (Hydrocodone-Acetaminophen) 4)  Lipitor 10 Mg Tabs (Atorvastatin Calcium) .... Take 1 Tablet By Mouth Once A Day 5)  Lisinopril-Hydrochlorothiazide 20-12.5 Mg Tabs (Lisinopril-Hydrochlorothiazide) .... Take 1 Tablet By Mouth Once A Day 6)  Metformin Hcl 500 Mg Tb24 (Metformin Hcl) .... Take 1 Two Times A Day 7)  Multivitamins   Tabs (Multiple Vitamin) 8)  Vitamin C 1000 Mg Tabs (Ascorbic Acid) .... Take 9)  Vitamin B-12 Cr 1500 Mcg  Tbcr (Cyanocobalamin) .[name].. 1 By Mouth Once Daily  Allergies (verified): 1)  Penicillin  Past History:  Past medical, surgical, family and social histories (including risk factors) reviewed, and no changes noted (except as noted below).  Past Medical History: Diabetes mellitus, type II with neuropathy Hyperlipidemia Hypertension Peripheral neuropathy  partial amputation right foot.     Past Surgical History: Reviewed history from 06/30/2008 and no changes required. 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 Charlsie Merles Neurology: [facility] Surgery: Dr. Luiz Blare  Family History: Reviewed history from 06/30/2009 and no changes required. [place] Family History Diabetes 1st degree relative daughter recently dxed  Mom: allzheiners father cva endarterectomy  paternal aunt ovarian ca  paternal uncles heart and stroke       Social History: Reviewed history from 09/16/2009 and no changes required. Former Smoker Married husband has vision problem and recurrent falls   moved to retirement place   [employer].   2010    closer to family.   Review of Systems       no cv pulm signs  and nochange in vision . no bleeding   syncope .   no swelling  Physical Exam  General:  Well-developed,well-nourished,in no acute distress; alert,appropriate and cooperative throughout examination Head:  normocephalic and atraumatic.   Eyes:  vision grossly intact.   Neck:  No deformities, masses, or tenderness noted. no bruits hear Lungs:  Normal respiratory effort, chest expands symmetrically. Lungs are clear to auscultation, no crackles or wheezes.no dullness.   Heart:  Normal rate and regular rhythm. S1 and S2 normal without gallop, murmur, click, rub or other extra sounds. Pulses:  present le  nl color and temperature Extremities:  right foot  with partial amputation   second toe now contracted with sligh redness at pressure point   and also 2-3 cm callus   without redness  at medial base metartarsal are      no ulcers otherwise.  Neurologic:  alert & oriented X3.  walks with  walker   and  neuropathic gait   cognition grossly normal and speech nl.  Skin:  turgor normal, color normal, no ecchymoses, and no petechiae.   Cervical Nodes:  No lymphadenopathy noted Psych:  Oriented X3, good eye contact, not anxious appearing, and not depressed appearing.     Impression & Recommendations:  Problem # 1:  DIABETES MELLITUS, TYPE II, WITH NEUROLOGICAL COMPLICATIONS (ICD-250.60) Assessment Improved  Her updated medication list for this problem includes:    Amaryl 4 Mg Tabs (Glimepiride) .[name]... Take 1 tablet by mouth twice a day    Aspir-81 81 Mg Tbec (Aspirin) .[name]... Take 1 tablet by mouth every morning    Lisinopril-hydrochlorothiazide 20-12.5 Mg Tabs (Lisinopril-hydrochlorothiazide) .[name]... Take 1 tablet by mouth once a day    Metformin Hcl 500 Mg Tb24 (Metformin hcl) .[name]... Take 1 two times a day  Labs Reviewed: Creat: 1.1 (11/18/2009)     Last Eye Exam: normal (12/10/2008) Reviewed HgBA1c results: 6.3 (11/18/2009)  6.4 (06/17/2009)  Problem # 2:  CALLUSES, RIGHT FOOT (ICD-700)   Assessment: Deteriorated recurred new  pressure point   and hammar toe  ....  concern as  this is how her previous ulcer and complication occured   needs to be recheck .  [place] the  foot surgeon who did the amputaiton   for follow up  intervention .     they will make appt.     Problem # 3:  ABNORMALITY OF GAIT (ICD-781.2) from neuropathy  no falls    may benefit from PT  .    see ortho first   Problem # 4:  HYPERTENSION (ICD-401.9) Assessment: Unchanged  Her updated medication list for this problem includes:  Lisinopril-hydrochlorothiazide 20-12.5 Mg Tabs (Lisinopril-hydrochlorothiazide) .[name]... Take 1 tablet by mouth once a day  BP today: 100/60 Prior BP: 110/60 (09/16/2009)  Prior 10 Yr Risk Heart Disease: 9 % (06/30/2009)  Labs Reviewed: K+: 4.9 (11/18/2009) Creat: : 1.1 (11/18/2009)   Chol: 139 (11/18/2009)   HDL: 48.10 (11/18/2009)   LDL: 61 (11/18/2009)   TG: 150.0 (11/18/2009)  Problem # 5:  HYPERLIPIDEMIA (ICD-272.4)  Her updated medication list for this problem includes:    Lipitor 10 Mg Tabs (Atorvastatin calcium) .[name]... Take 1 tablet by mouth once a day  Labs Reviewed: SGOT: 20 (11/18/2009)   SGPT: 18 (11/18/2009)  Prior 10 Yr Risk Heart Disease: 9 % (06/30/2009)   HDL:48.10 (11/18/2009), 45.60 (10/28/2008)  LDL:61 (11/18/2009), 67 (10/28/2008)  Chol:139 (11/18/2009), 138 (10/28/2008)  Trig:150.0 (11/18/2009), 129.0 (10/28/2008)  Problem # 6:  ANEMIA (ICD-285.9) resolved     vit b12 l3wvel is normal now     continue  Her updated medication list for this problem includes:    Vitamin B-12 Cr 1500 Mcg Tbcr (Cyanocobalamin) .[name]... 1 by mouth once daily  Complete Medication List: 1)  Amaryl 4 Mg Tabs (Glimepiride) .... Take 1 tablet by mouth twice a day 2)  Aspir-81 81 Mg Tbec (Aspirin) .... Take 1 tablet by mouth every morning 3)  Hydrocodone-acetaminophen 5-500 Mg Tabs (Hydrocodone-acetaminophen) 4)  Lipitor 10 Mg Tabs (Atorvastatin calcium) .... Take 1 tablet by mouth once a day 5)   Lisinopril-hydrochlorothiazide 20-12.5 Mg Tabs (Lisinopril-hydrochlorothiazide) .... Take 1 tablet by mouth once a day 6)  Metformin Hcl 500 Mg Tb24 (Metformin hcl) .... Take 1 two times a day 7)  Multivitamins Tabs (Multiple vitamin) 8)  Vitamin C 1000 Mg Tabs (Ascorbic acid) .... Take 9)  Vitamin B-12 Cr 1500 Mcg Tbcr (Cyanocobalamin) .[name].. 1 by mouth once daily  Hypertension Assessment/Plan:      The patient's hypertensive risk group is category C: Target organ damage and/or diabetes.  Her calculated 10 year risk of coronary heart disease is 9 %.  Today's blood pressure is 100/60.  Her blood pressure goal is < 130/80.  Patient Instructions: 1)  Get appt wiht foot/ ortho Dr Lestine Box  2)  no change in meds  3)  I think Physical therapy blaance assessment  and traning may be helpful 4)  call if you want [place] to do that referral.  5)  otherwise      ROV  4 months     6)  hg a1c  before visit .  Prescriptions: METFORMIN HCL 500 MG TB24 (METFORMIN HCL) Take 1 two times a day  #180 x 3   Entered and Authorized by:   Madelin Headings MD   Signed by:   Madelin Headings MD on 11/26/2009   Method used:   Electronically to        [facility]* (retail)             ,          Ph: [phone]       Fax: [phone]   RxID:   [id number] LISINOPRIL-HYDROCHLOROTHIAZIDE 20-12.5 MG TABS (LISINOPRIL-HYDROCHLOROTHIAZIDE) Take 1 tablet by mouth once a day  #90 x 3   Entered and Authorized by:   Madelin Headings MD   Signed by:   Madelin Headings MD on 11/26/2009   Method used:   Electronically to        [facility]* (retail)             ,  Ph: [phone]       Fax: [phone]   RxID:   [id number] LIPITOR 10 MG TABS (ATORVASTATIN CALCIUM) Take 1 tablet by mouth once a day  #90 x 3   Entered and Authorized by:   Madelin Headings MD   Signed by:   Madelin Headings MD on 11/26/2009   Method used:   Electronically to        [facility]* (retail)             ,          Ph:  [phone]       Fax: [phone]   RxID:   [id number] AMARYL 4 MG TABS (GLIMEPIRIDE) Take 1 tablet by mouth twice a day  #180 x 3   Entered and Authorized by:   Madelin Headings MD   Signed by:   Madelin Headings MD on 11/26/2009   Method used:   Electronically to        [facility]* (retail)             ,          Ph: [phone]       Fax: [phone]   RxID:   [id number]

## 2010-05-13 NOTE — Assessment & Plan Note (Signed)
Summary: 4 month rov/njr hus rsc/njr   Vital Signs:  Patient profile:   77 year old female Menstrual status:  postmenopausal Height:      66 inches Weight:      160 pounds BMI:     25.92 Pulse rate:   78 / minute BP sitting:   110 / 80  (right arm) Cuff size:   regular  Vitals Entered By: Romualdo Bolk, CMA (AAMA) (April 09, 2010 10:11 AM) CC: Follow-up visit on labs, Hypertension Management, diabetes     History of Present Illness: Natasha West. comes in today  for follow up of multiple medical problems with daughter . Since last visit  has had no low bg but not checking levels.  NO change in meds.  Daughter says some  memory issues. Still living at [employer] with her husband. Has seen Dr Lou Miner  for her feet and she was xocnerend about her right middle toe and slin areas on leg(  that are not new concern about poss skin cancer ) .Apparently saw Dr Lestine Box at some point  as a consult .  to get new shoes>   NO  home care. at present   Hypertension History:      She complains of neurologic problems, but denies headache, chest pain, palpitations, dyspnea with exertion, orthopnea, PND, peripheral edema, visual symptoms, syncope, and side effects from treatment.  She notes no problems with any antihypertensive medication side effects.  Feet are still a problem.        Positive major cardiovascular risk factors include female age 24 years old or older, diabetes, hyperlipidemia, and hypertension.  Negative major cardiovascular risk factors include non-tobacco-user status.      Preventive Screening-Counseling & Management  Alcohol-Tobacco     Alcohol drinks/day: 0     Smoking Status: quit     Year Quit: 25 years ago  Caffeine-Diet-Exercise     Caffeine use/day: 1-2 a week     Diet Counseling: to improve diet; diet is suboptimal     Nutrition Referrals: no     Does Patient Exercise: no  Comments: no recent falls  Current Medications (verified): 1)  Amaryl 4  Mg Tabs (Glimepiride) .... Take 1 Tablet By Mouth Twice A Day 2)  Aspir-81 81 Mg Tbec (Aspirin) .... Take 1 Tablet By Mouth Every Morning 3)  Hydrocodone-Acetaminophen 5-500 Mg Tabs (Hydrocodone-Acetaminophen) 4)  Lipitor 10 Mg Tabs (Atorvastatin Calcium) .... Take 1 Tablet By Mouth Once A Day 5)  Lisinopril-Hydrochlorothiazide 20-12.5 Mg Tabs (Lisinopril-Hydrochlorothiazide) .... Take 1 Tablet By Mouth Once A Day 6)  Metformin Hcl 500 Mg Tb24 (Metformin Hcl) .... Take 1 Two Times A Day 7)  Multivitamins   Tabs (Multiple Vitamin) 8)  Vitamin C 1000 Mg Tabs (Ascorbic Acid) .... Take 9)  Vitamin B-12 Cr 1500 Mcg  Tbcr (Cyanocobalamin) .[name].. 1 By Mouth Once Daily  Allergies (verified): 1)  Penicillin  Past History:  Past medical, surgical, family and social histories (including risk factors) reviewed, and no changes noted (except as noted below).  Past Medical History: Reviewed history from 11/26/2009 and no changes required. Diabetes mellitus, type II with neuropathy Hyperlipidemia Hypertension Peripheral neuropathy  partial amputation right foot.     Past Surgical History: Reviewed history from 06/30/2008 and no changes required. 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 Charlsie Merles Neurology: [facility] Surgery: Dr. Luiz Blare  Family History: Reviewed history from 06/30/2009 and no changes required. [place] Family History Diabetes 1st degree relative daughter recently dxed  Mom: allzheiners father cva endarterectomy  paternal aunt ovarian ca  paternal uncles heart and stroke       Social History: Reviewed history from 11/26/2009 and no changes required. Former Smoker Married husband has vision problem and recurrent falls   moved to retirement place   [employer].   2010     closer to family.   Review of Systems  The patient denies anorexia, fever, weight gain, decreased hearing, chest pain, syncope, dyspnea on exertion, prolonged cough, hemoptysis, unusual weight change, abnormal bleeding, and enlarged lymph nodes.         no falling   ? memory problems per  daughter  Physical Exam  General:  alert, well-developed, well-nourished, and well-hydrated.   Head:  normocephalic and atraumatic.   Eyes:  vision grossly intact.   Neck:  No deformities, masses, or tenderness noted. Lungs:  Normal respiratory effort, chest expands symmetrically. Lungs are clear to auscultation, no crackles or wheezes.no dullness.   Heart:  Normal rate and regular rhythm. S1 and S2 normal without gallop, murmur, click, rub or other extra sounds. Extremities:  righ t foot  partial amputation   4th" toe is dusky red sausage swelling like contracted and  small ulcer  on pip contracted toe.  also samll callus  onver metatarsal  no edema    left foot  clear  Neurologic:  oreinted to time and person  altered gait from foot issues  but independent walking  orhto pedic shoes Skin:  see  ext exam Cervical Nodes:  No lymphadenopathy noted Psych:  normally interactive and good eye contact.     Impression & Recommendations:  Problem # 1:  DIABETIC FOOT ULCER, TOE (ICD-707.15) thi is new  and  patient did not seems a bit nonchalant for this  but worse accd to daughter than a few weeks ago when podiatry  saw her .Needs to see ortho and have wounda care but this has been problematic in the past .   but prob needs  to readdress this.   Problem # 2:  INFECTION, TOE (ICD-686.9) Assessment: Comment Only  Problem # 3:  DIABETES MELLITUS, TYPE II, WITH NEUROLOGICAL COMPLICATIONS (ICD-250.60) a1c controlled  Her updated medication list for this problem includes:    Amaryl 4 Mg Tabs (Glimepiride) .[name]... Take 1 tablet by mouth twice a day    Aspir-81 81 Mg Tbec (Aspirin) .[name]... Take 1 tablet by  mouth every morning    Lisinopril-hydrochlorothiazide 20-12.5 Mg Tabs (Lisinopril-hydrochlorothiazide) .[name]... Take 1 tablet by mouth once a day    Metformin Hcl 500 Mg Tb24 (Metformin hcl) .[name]... Take 1 two times a day  Labs Reviewed: Creat: 1.1 (11/18/2009)     Last Eye Exam: normal (12/10/2008) Reviewed HgBA1c results: 6.3 (03/18/2010)  6.3 (11/18/2009)  Problem # 4:  HYPERTENSION (ICD-401.9)  Her updated medication list for this problem includes:    Lisinopril-hydrochlorothiazide 20-12.5 Mg Tabs (Lisinopril-hydrochlorothiazide) .[name]... Take 1 tablet by mouth once a day  Problem # 5:  HYPERLIPIDEMIA (ICD-272.4)  Her updated medication list for this problem includes:    Lipitor 10 Mg Tabs (Atorvastatin calcium) .[name]... Take 1 tablet by mouth once a day  Problem # 6:  concerns about memory briefly dis with daughter but  the foot infection ulcer is more urgent today and  will disc again at follow up visit.  Complete Medication List: 1)  Amaryl 4 Mg Tabs (Glimepiride) .... Take 1 tablet by mouth twice a day 2)  Aspir-81 81 Mg Tbec (Aspirin) .... Take 1 tablet by mouth every morning 3)  Hydrocodone-acetaminophen 5-500 Mg Tabs (Hydrocodone-acetaminophen) 4)  Lipitor 10 Mg Tabs (Atorvastatin calcium) .... Take 1 tablet by mouth once a day 5)  Lisinopril-hydrochlorothiazide 20-12.5 Mg Tabs (Lisinopril-hydrochlorothiazide) .... Take 1 tablet by mouth once a day 6)  Metformin Hcl 500 Mg Tb24 (Metformin hcl) .... Take 1 two times a day 7)  Multivitamins Tabs (Multiple vitamin) 8)  Vitamin C 1000 Mg Tabs (Ascorbic acid) .... Take 9)  Vitamin B-12 Cr 1500 Mcg Tbcr (Cyanocobalamin) .[name].. 1 by mouth once daily 10)  Avelox Abc Pack 400 Mg Tabs (Moxifloxacin hcl) .[name].. 1 by mouth once daily  Other Orders: Orthopedic Referral (Ortho)  Hypertension Assessment/Plan:      The patient's hypertensive risk group is category C: Target organ damage and/or diabetes.  Her calculated 10 year risk of coronary  heart disease is 15 %.  Today's blood pressure is 110/80.  Her blood pressure goal is < 130/80.  Patient Instructions: 1)  daibetes is in control  2)  am worried about ulcer on your right foot toe/ 3)  begin antibioitc and appt with Dr Luiz Blare ( Md who did prev amputation) 4)  we need to have regular foot checks   5)  the skin areas may not be cancer but senile keratosis but should be either seen by dermatologist or  biopsied but should  the foot ulcer is more immediate concern.  6)  Plan return office visit after seen for foot problem  Prescriptions: AVELOX ABC PACK 400 MG TABS (MOXIFLOXACIN HCL) 1 by mouth once daily  #10 x 0   Entered and Authorized by:   Madelin Headings MD   Signed by:   Madelin Headings MD on 04/09/2010   Method used:   Electronically to        [facility]. [phone]* (retail)       [phone]       [place], [place]  [address]       Ph: [phone]       Fax: [phone]   RxID:   [id number]    Orders Added: 1)  Orthopedic Referral [Ortho] 2)  Est. Patient Level V [02725]   greater than 50% of visit spent in counseling   reviewing and arranging   further care for her foot infection ulcer . 45 minutes   WKP.

## 2010-05-13 NOTE — Letter (Signed)
Summary: Request for Surgical Clearance/Dr. Jodi Geralds   Request for Surgical Clearance/Dr. Jodi Geralds   Imported By: Maryln Gottron 04/21/2010 12:44:03  _____________________________________________________________________  External Attachment:    Type:   Image     Comment:   External Document

## 2010-05-13 NOTE — Letter (Signed)
Summary: Generic Letter  [place] [facility]  [address] [place], [place] [address]   Phone: [phone]  Fax: [phone]    04/14/2010  Attn: Jodi Geralds, MD Re: Natasha West, DOB: 1928/01/15  Patient was seen on 04/09/2010 and is medically cleared to have surgery on 04/19/2010.           Sincerely,   Neta Mends. Fabian Sharp, MD

## 2010-05-19 NOTE — Consult Note (Signed)
Summary: [facility]  [facility]   Imported By: Maryln Gottron 05/10/2010 13:02:01  _____________________________________________________________________  External Attachment:    Type:   Image     Comment:   External Document

## 2010-06-18 ENCOUNTER — Telehealth: Payer: Self-pay | Admitting: Internal Medicine

## 2010-06-18 NOTE — Telephone Encounter (Signed)
Triage vm------checking on status of form to be sent to podiatrist, Dr Gregary Signs. Please return call to daughter. Needs asap.

## 2010-06-22 NOTE — Telephone Encounter (Signed)
Left message to call back  

## 2010-06-22 NOTE — Telephone Encounter (Signed)
This has been done  But poss not sent yet form charge.

## 2010-06-23 DIAGNOSIS — Z0279 Encounter for issue of other medical certificate: Secondary | ICD-10-CM

## 2010-06-25 NOTE — Telephone Encounter (Signed)
Pt's daughter aware that we faxed the form over to Dr. Mervyn Skeeters

## 2010-07-02 ENCOUNTER — Encounter: Payer: Self-pay | Admitting: Internal Medicine

## 2010-07-08 ENCOUNTER — Ambulatory Visit (INDEPENDENT_AMBULATORY_CARE_PROVIDER_SITE_OTHER): Payer: Medicare Other | Admitting: Internal Medicine

## 2010-07-08 ENCOUNTER — Encounter: Payer: Self-pay | Admitting: Internal Medicine

## 2010-07-08 VITALS — BP 110/60 | HR 88 | Ht 66.0 in | Wt 154.0 lb

## 2010-07-08 DIAGNOSIS — G609 Hereditary and idiopathic neuropathy, unspecified: Secondary | ICD-10-CM

## 2010-07-08 DIAGNOSIS — E1149 Type 2 diabetes mellitus with other diabetic neurological complication: Secondary | ICD-10-CM

## 2010-07-08 DIAGNOSIS — E785 Hyperlipidemia, unspecified: Secondary | ICD-10-CM

## 2010-07-08 DIAGNOSIS — R413 Other amnesia: Secondary | ICD-10-CM

## 2010-07-08 NOTE — Patient Instructions (Addendum)
Need to see pociatry regularly  And in the next   Few weeks   To check the on your foot regularly.   Check up  In  5 months   If you are interested in memory   Medications  Call and we can discuss this but seems that   Things are medically stable.

## 2010-07-08 NOTE — Progress Notes (Signed)
  Subjective:    Patient ID: Natasha West, female    DOB: 07/01/1927, 77 y.o.   MRN: [id number]  HPI Pt comesin for follow up of  multiple medical issues  She is by herself today  husba d  has visit also. Since lastvisit she had toe ulcer  3rd right  Amp per Dr Luiz Blare   Done and doing well.  DM   No lows   Seems to have good control with meds  HT no change and controlled utd on  eye doctorcheck   Doing well.  No change in vision. Review of Systems Neg Cp sob bleeding falling  Change in her bowel habits  uti sx or skin changes  Memory has good day s and bad days  Problem with word finding  Not too troubled by this  NO longer driving  Getting rides .  Past Medical History  Diagnosis Date  . Diabetes mellitus   . Hyperlipidemia   . Hypertension   . Peripheral neuropathy     partial amputation rt foot  . Memory difficulty    Past Surgical History  Procedure Date  . Total knee arthroplasty   . Elbow fracture surgery     left with orif  . Amputation 2009    from gangrene rt 4th toe [place]  . Amputation 03 15 2010    Rt great toe and rt distal phalanx  . Toe amputation 1 12    right 3rd for ulcer  [place]     reports that she has quit smoking. She does not have any smokeless tobacco history on file. Her alcohol and drug histories not on file. family history includes Alzheimer's disease in her mother; Diabetes type II in her daughter; and Other in her father. Allergies  Allergen Reactions  . Penicillins     REACTION: unspecified       Objective:   Physical Exam WDWN in nad  Well groomed speech nl  oriented x 3  Has hard time remembering name of doctors but able to give good hx.  HEENT grossly normal Neck no masses of bruits Chest CTA bs =  CV s1 s2 no g or m Extr feet right foot with amputated toe and great toe  With lams wool small callus on bottom of foot. Red area corn type on right later al to no ulcer or swelling  Walks  With roller chair  Very well   No cogwheeling   No tremor. Able to bend over and recover very well.        Assessment & Plan:    DM with neuropathy  Controlled  Resulting in partial amputation right foot   Has some slight redness of little toe today   HT Controled  Memory  Formal testing not done today  Is a candidate  For  Medication  ie Aricept .   And disc poss of this  If she wishes.  Can call for this . Of follow up    Other wise fu in 4-6 months for  Wellness visit and labs disease management then  Total visit > 50% spent counseling and coordinating care

## 2010-07-11 ENCOUNTER — Encounter: Payer: Self-pay | Admitting: Internal Medicine

## 2010-07-22 LAB — GLUCOSE, CAPILLARY
Glucose-Capillary: 112 mg/dL — ABNORMAL HIGH (ref 70–99)
Glucose-Capillary: 116 mg/dL — ABNORMAL HIGH (ref 70–99)
Glucose-Capillary: 119 mg/dL — ABNORMAL HIGH (ref 70–99)
Glucose-Capillary: 127 mg/dL — ABNORMAL HIGH (ref 70–99)
Glucose-Capillary: 134 mg/dL — ABNORMAL HIGH (ref 70–99)
Glucose-Capillary: 146 mg/dL — ABNORMAL HIGH (ref 70–99)
Glucose-Capillary: 54 mg/dL — ABNORMAL LOW (ref 70–99)
Glucose-Capillary: 66 mg/dL — ABNORMAL LOW (ref 70–99)
Glucose-Capillary: 73 mg/dL (ref 70–99)
Glucose-Capillary: 85 mg/dL (ref 70–99)
Glucose-Capillary: 88 mg/dL (ref 70–99)
Glucose-Capillary: 98 mg/dL (ref 70–99)
Glucose-Capillary: 98 mg/dL (ref 70–99)

## 2010-07-22 LAB — BASIC METABOLIC PANEL
BUN: 37 mg/dL — ABNORMAL HIGH (ref 6–23)
BUN: 9 mg/dL (ref 6–23)
CO2: 19 mEq/L (ref 19–32)
CO2: 22 mEq/L (ref 19–32)
Calcium: 8.5 mg/dL (ref 8.4–10.5)
Calcium: 8.7 mg/dL (ref 8.4–10.5)
Chloride: 109 mEq/L (ref 96–112)
Chloride: 98 mEq/L (ref 96–112)
Creatinine, Ser: 1.01 mg/dL (ref 0.4–1.2)
GFR calc Af Amer: >60 mL/min (ref >60)
GFR calc non Af Amer: >60 mL/min (ref >60)
GFR calc non Af Amer: >60 mL/min (ref >60)
GFR calc non Af Amer: >60 mL/min (ref >60)
Glucose, Bld: 141 mg/dL — ABNORMAL HIGH (ref 70–99)
Glucose, Bld: 87 mg/dL (ref 70–99)
Potassium: 3.6 mEq/L (ref 3.5–5.1)
Potassium: 3.7 mEq/L (ref 3.5–5.1)
Potassium: 3.9 mEq/L (ref 3.5–5.1)
Sodium: 139 mEq/L (ref 135–145)
Sodium: 140 mEq/L (ref 135–145)

## 2010-07-22 LAB — CBC
HCT: 31.1 % — ABNORMAL LOW (ref 36.0–46.0)
HCT: 32.5 % — ABNORMAL LOW (ref 36.0–46.0)
HCT: 32.8 % — ABNORMAL LOW (ref 36.0–46.0)
Hemoglobin: 10.6 g/dL — ABNORMAL LOW (ref 12.0–15.0)
Hemoglobin: 11.2 g/dL — ABNORMAL LOW (ref 12.0–15.0)
MCHC: 34.1 g/dL (ref 30.0–36.0)
MCHC: 34.3 g/dL (ref 30.0–36.0)
MCV: 89.4 fL (ref 78.0–100.0)
MCV: 89.6 fL (ref 78.0–100.0)
Platelets: 323 10*3/uL (ref 150–400)
Platelets: 359 10*3/uL (ref 150–400)
RBC: 3.45 MIL/uL — ABNORMAL LOW (ref 3.87–5.11)
RBC: 3.62 MIL/uL — ABNORMAL LOW (ref 3.87–5.11)
RDW: 14.4 % (ref 11.5–15.5)
WBC: 7.1 10*3/uL (ref 4.0–10.5)

## 2010-07-22 LAB — DIFFERENTIAL
Basophils Relative: 1 % (ref 0–1)
Eosinophils Absolute: 0 10*3/uL (ref 0.0–0.7)
Lymphs Abs: 1.7 10*3/uL (ref 0.7–4.0)
Monocytes Absolute: 0.5 10*3/uL (ref 0.1–1.0)
Monocytes Relative: 6 % (ref 3–12)
Neutrophils Relative %: 73 % (ref 43–77)

## 2010-07-22 LAB — CULTURE, BLOOD (ROUTINE X 2)

## 2010-07-22 LAB — CK TOTAL AND CKMB (NOT AT ARMC): Total CK: 109 U/L (ref 7–177)

## 2010-08-24 NOTE — Discharge Summary (Signed)
[name], Natasha West               ACCOUNT NO.:  [id number]   MEDICAL RECORD NO.:  [id number]          PATIENT TYPE:  INP   LOCATION:  5021                         FACILITY:  [facility]   PHYSICIAN:  Valerie A. Felicity Coyer, MDDATE OF BIRTH:  1927/08/26   DATE OF ADMISSION:  06/17/2008  DATE OF DISCHARGE:  06/23/2008                               DISCHARGE SUMMARY   DISCHARGE DIAGNOSES:  1. Acute osteomyelitis of the right first metatarsophalangeal with      gangrene of the second distal right toe and nonhealing right foot      wound status post partial first ray amputation of the right foot      and partial amputation of the right second toe.  2. Type 2 diabetes with peripheral neuropathy.  Continue home      medications.  3. Anemia of chronic disease with mild acute blood loss, discharge      hemoglobin 10.5.  4. Mild hyponatremia due to diuretic use prior to admission, resolved.  5. Hypertension.  6. Dyslipidemia.   DISCHARGE MEDICATIONS:  1. Vicodin 5/500 one p.o. q.4 h. p.r.n. pain.  2. Avelox 400 mg p.o. daily x3 additional days to complete total 10-      day empiric antibiotic course.   Other medications are as prior to admission without change and include:  1. Amaryl 4 mg p.o. b.i.d.  2. Aspirin 81 mg daily.  3. Lipitor 10 mg p.o. at bedtime.  4. Lisinopril and hydrochlorothiazide 20/12.5 p.o. daily.  5. Metformin 500 mg b.i.d.  6. Calcium with D 500/200 two tablets daily.  7. Vitamin C 1000 mg daily.  8. Vitamin B12 1500 mcg daily.   DISPOSITION:  The patient is discharged home where she now lives with  the supervision and assistance of her daughter.  Her written instruction  from Dr. Luiz Blare staff the patient may be weight bear as tolerated on the  right with walker while putting weight on her heel and per family report  is to otherwise use wheelchair with right foot support, DME as ordered.   HOSPITAL FOLLOWUP:  With Dr. Luiz Blare, Orthopedic in 2 weeks for wound  recheck.   The patient is instructed for dressing change every other day,  clarification pending at the time of this dictation.  I have also  arranged followup with her primary care physician for later this week,  Dr. Fabian Sharp for Thursday June 26, 2008 at 8:45 to ensure adequate follow-  through on this and other medical issues.   CONDITION ON DISCHARGE:  Medically stable having reached maximal benefit  of acute hospitalization and ready for discharge home.   HOSPITAL COURSE:  Acute osteomyelitis in the right first MTP with  nonhealing foot wound.  The patient is a 77 year old diabetic with a  nonhealing right foot wound who has been undergoing care with the  podiatrist and wound care, status post partial amputation in December  2009, but due to nature of decline with increased drainage despite  aggressive outpatient wound care, she was admitted by her primary care  physician on the day of admission for further evaluation and  treatment.  A plain x-ray did suggest acute osteomyelitis of the right first MTP and  the patient had a markedly elevated sed rate at 82, despite being  afebrile, nontoxic with a normal white count of 8.6.  The patient has  had long-standing peripheral neuropathy related to her diabetes and has  had no experience of pain at any time.  The patient was seen in  consultation by Dr. Luiz Blare at [facility] on the day of  admission who agreed with need for partial ray amputation to further  wound care efforts and promote healing.  She had been placed empirically  on vancomycin and Avelox but culture data was negative for any  particular bacterial infection.  She underwent surgery on June 20, 2008  without complication and pain has been well controlled postoperatively  with Vicodin p.r.n.  At this time, she is felt to reach maximum benefit  of hospitalization and is felt stable from an orthopedic and medical  standpoint for discharge home.  Instructions from Orthopedics for  a  followup in 2 weeks.  Continue dressing change every other day and  Vicodin p.r.n.  I have also included antibiotic therapy with another 3  days to complete 10 days total of empiric course of treatment, though  after surgery this may not be as necessary again having negative actual  culture data to the guide therapy and infectious source now removed.  The patient will continue her other medications as prior to admission  for chronic medical conditions such as diabetes, dyslipidemia,  hypertension and a followup with her primary care physician on this and  her other issues as needed.   Greater than 30 minutes spent on day of discharge coordination for the  patient care, followup, prescription, and discharge planning.      Valerie A. Felicity Coyer, MD  Electronically Signed     VAL/MEDQ  D:  06/23/2008  T:  06/24/2008  Job:  [id number]

## 2010-08-24 NOTE — Discharge Summary (Signed)
NAMEVERIA, STRADLEY               ACCOUNT NO.:  [id number]   MEDICAL RECORD NO.:  [id number]          PATIENT TYPE:  INP   LOCATION:  1433                         [facility]ACILITY:  [facility]   PHYSICIAN:  Valetta Mole. Swords, MD    DATE OF BIRTH:  10/03/1927   DATE OF ADMISSION:  01/23/2007  DATE OF DISCHARGE:  01/25/2007                               DISCHARGE SUMMARY   DISCHARGE DIAGNOSES:  1. Cellulitis, right foot, associated with a large callus.  2. Confusion likely secondary to above.  3. Peripheral neuropathy.  4. Diabetes.  5. Tachycardia seems to be resolved.   DISCHARGE MEDICATIONS:  1. Avelox 400 mg p.o. daily for 7 days.  2. Metformin 500 mg p.o. daily.  3. Glimepiride 8 mg p.o. daily.  4. Lipitor 10 mg p.o. daily.  5. Lisinopril 20/12.5 one p.o. daily.  6. Vicodin p.r.n.  7. Aspirin 81 mg p.o. daily.   HOSPITAL PROCEDURES:  1. MRI of the head, MRA of the head:  No acute findings.  2. MRI of the foot demonstrates mild fasciitis without osteomyelitis      of the right foot.   HOSPITAL LABORATORIES:  Blood cultures negative to date.  CBC on October  15th demonstrates a white count of 15.6, hemoglobin of 12, platelet  count 241,000.  Hemoglobin A1c at 6.9%.  Urinalysis unremarkable.   HOSPITAL COURSE:  The patient admitted to the Hospitalist Service on  January 23, 2007, see Dr. Chancy Milroy note for details.  The patient with  a cellulitis.  Treated aggressively with Avelox and Vancomycin  empirically.  Symptoms resolved quickly.  MRI of the foot was done with  results as above.  The patient seen by Wound Care consult.  The patient  has a large callus unable to be trimmed in the hospital.  Recommendation  was to treat with Neosporin, I will ask Dr. Fabian Sharp to consider wound  care consult.   Confusion likely secondary to cellulitis, MRI of the head was normal.  Mental status back to normal.  Resting tachycardia documented in the  hospital.  At the time of my examination, her  tachycardia had resolved.   Type 2 diabetes, A1c seems to be reasonably well controlled.  She does  have complications with diabetic peripheral neuropathy.  She understands  to monitor her feet daily and wear good shoes.      Bruce Rexene Edison Swords, MD  Electronically Signed     BHS/MEDQ  D:  01/25/2007  T:  01/25/2007  Job:  [id number]

## 2010-08-24 NOTE — Op Note (Signed)
Natasha West, Natasha West               ACCOUNT NO.:  [id number]   MEDICAL RECORD NO.:  [id number]          PATIENT TYPE:  INP   LOCATION:  1436                         FACILITY:  [facility]   PHYSICIAN:  Lenn Sink, D.P.M.DATE OF BIRTH:  28-Oct-1927   DATE OF PROCEDURE:  04/02/2008  DATE OF DISCHARGE:  04/03/2008                               OPERATIVE REPORT   SURGEON:  Lenn Sink, DPM   ASSISTANT:  Alvan Dame, DPM.   PREOPERATIVE DIAGNOSIS:  Gangrenous fourth toe, right foot.   POSTOPERATIVE DIAGNOSIS:  Gangrenous fourth toe, right foot.   ANESTHESIA:  IV sedation with local infiltration.   PROCEDURE PERFORMED:  Amputation, fourth digit right foot.   INDICATIONS:  The patient developed over approximate 10-day period a  progressive necrosis of the fourth digit, right.  Also had some mild  cellulitic processes, which were resolving utilizing vancomycin.  The  patient had been seen by Dr. Jerl Santos, myself, and Dr. Ralene Cork all in  agreement that due to necrosis and gangrenous-type condition that fourth  toe amputation was necessary.   DESCRIPTION OF PROCEDURE:  The patient was brought back to the OR and  sedation was obtained.  The patient had the area numbed with 10 mL of  Xylocaine and Marcaine mixture.  The fourth toe was identified and a  fish-mouth incision was made so as to effectively remove the fourth toe  in total.  The wound was flushed with copious gentamicin solution.  We  took the toe.  We did send it for pathologic evaluation.  We did  thorough evaluation of the tissue within the fourth interspace and  proximal; there was no pus, no indications of infection, it appears to  be a very clean process.  We did utilize the forced flush system to  completely flush this area with several hundred milliliters of flush and  we then went ahead and sutured with 5-0 nylon.  We did put a Penrose  type drain in, in order to evaluate and we did do deep cultures both  aerobic  and anaerobic.  The patient had sterile dressing applied and was  sent to recovery and back to her room in satisfactory condition.  She  will have PICC line and will continue with IV antibiotics for 1 week per  her medical doctor.           ______________________________  Lenn Sink, D.P.M.     NSR/MEDQ  D:  04/23/2008  T:  04/24/2008  Job:  [id number]

## 2010-08-24 NOTE — Discharge Summary (Signed)
[name], [name]               ACCOUNT NO.:  [id number]   MEDICAL RECORD NO.:  [id number]          PATIENT TYPE:  INP   LOCATION:  1436                         FACILITY:  [facility]   PHYSICIAN:  Rosalyn Gess. Norins, MD  DATE OF BIRTH:  27-Apr-1927   DATE OF ADMISSION:  03/31/2008  DATE OF DISCHARGE:  04/03/2008                               DISCHARGE SUMMARY   ADMITTING DIAGNOSIS:  Infection of the 4th right toe.   DISCHARGE DIAGNOSIS:  Gangrenous 4th right toe status post a ray  amputation by Dr. Cristie Hem.  The patient had been seen in  consultation by Dr. Marcene Corning.   PROCEDURES:  1. Ray amputation as noted on December 23.  2. PICC line placement with followup chest x-ray which was      unremarkable.   HISTORY OF PRESENT ILLNESS:  Natasha West is a 77 year old Caucasian  female followed by Dr. Berniece Andreas at [facility] [place].  She is a known diabetic with diabetic neuropathy.  She was sent to the  emergency department by her podiatrist because of a 4th toe infection.  She had been started on a p.o. antibiotic which was not recalled, but it  did cause stomach upset.  On presentation to the emergency department,  her 4th right toe was black, foul-smelling with copious discharge.  She  also had a large ulcerated callus on the bottom of her right foot.  The  patient reports that her podiatrist thought that this was looking well.  The patient was subsequently admitted with probable gangrene.   Please see the H and P for past medical history, family history and  social history.   HOSPITAL COURSE:  The patient was admitted to a regular bed.  She has  been seen in the ER in consultation by Dr. Marcene Corning who  recommended amputation.  The patient was hesitant to move forward.  She  did have a PICC line placed and was started on IV antibiotics with  vancomycin.  By the 22nd, the patient decided she was willing to move  forward with surgery, and surgery was  scheduled with Dr. Charlsie Merles.  No op  report is available at the time of discharge.  According to the patient,  Dr. Charlsie Merles had told her she would be able to be discharged on the 1st  postop day, recommending IV vancomycin for at least 1 week.  ID has not  been consulted.   The patient is doing well on the 1st postoperative day.  She is  afebrile.Natasha West  Her CBC is normal with a white count of 6800.  She has a dry  dressing intact on her right foot which was not removed.  At this point,  if she is cleared by Dr. Charlsie Merles or his associates for discharge home, she  is stable from a medical perspective.   The patient's other medical problems remained stable.  She should  continue with her normal home regimen.  She should monitor closely her  capillary blood glucose and call Dr. Fabian Sharp if she has any problems with  high blood sugars.  DISCHARGE EXAMINATION:  Temperature was 98.8, blood pressure 119/56,  heart rate 83, respirations 20, O2 saturations 94% on room air.  Her CBG  was 138.  GENERAL APPEARANCE:  This is a pleasant 77 year old Caucasian woman,  clear in her desires to go home having been promised this by her  podiatric surgeon.  CHEST:  Clear.  She had a regular rate and rhythm.  Dressing was dry.   DISPOSITION:  The patient to be discharged to home if cleared by her  surgeon.  She will need to have home health for daily administration of  vancomycin.  We will ask the pharmacy to establish a once a day dose.  The patient will continue on ciprofloxacin 500 mg b.i.d. for an  additional 7 days.  She will have oxycodone with Tylenol for pain as  needed.   The patient will need to arrange a followup appointment with her  podiatric surgeon.  She should see Dr. Berniece Andreas on an as-needed  basis.  The patient's condition at time of discharge dictation is  medically stable, awaiting surgical clearance for discharge.      Rosalyn Gess Norins, MD  Electronically Signed     MEN/MEDQ  D:   04/03/2008  T:  04/03/2008  Job:  [id number]   cc:   Lenn Sink, D.P.M.  Fax: [phone]   Lubertha Basque. Jerl Santos, M.D.  Fax: [phone]   Neta Mends. Fabian Sharp, MD  [address] [place], [place] [address]

## 2010-08-24 NOTE — H&P (Signed)
NAMEADDYSEN, Natasha West               ACCOUNT NO.:  [id number]   MEDICAL RECORD NO.:  [id number]         PATIENT TYPE:  INP   LOCATION:  0105                         FACILITY:  [facility]   PHYSICIAN:  Hollice Espy, M.D.DATE OF BIRTH:  07/19/1927   DATE OF ADMISSION:  01/23/2007  DATE OF DISCHARGE:                              HISTORY & PHYSICAL   HISTORY & PHYSICAL   PRIMARY CARE PHYSICIAN:  Dr. Berniece Andreas,  Healthcare   CHIEF COMPLAINT:  Confusion.   HISTORY OF PRESENT ILLNESS:  The patient is a 77 year old white female  with a past medical history of obesity and diabetes mellitus, today her  daughter noted that she appeared to be much more confused and less  lethargic, so she brought her into the emergency room.  The patient was  started on some IV fluids and she was found to have a bad looking  ulceration to the medial aspect of her right first toe and some  surrounding cellulitis and swelling of her right leg with this finding  and she was given some antibiotics.  She appears to be more awake and  alert, although the daughter still notes some minor slowed speech but  says her confusion appears to be close to normal.  She states that she  is almost back to her baseline.  The patient herself says that she has  been feeling fine.  In terms of past medical history she states that her  diabetes is well controlled.  The daughter thinks occasionally she may  have had some highs or some lows.  In the interim she has developed an  ulceration over her right foot, which the patient refers to as a  callous, but has not really followed up with her PCP over it.  Again she  was brought into the emergency room, labs were checked, and the patient  was found to have a white count of 22 with a 90% shift.  Her H&H was  stable.  She was noted to be tachycardic with the heart rate in 130 to  140s.  An EKG confirmed that this was indeed sinus tachycardia.  With  these findings and a clear  chest x-ray and urine it was felt that she  likely had infection with early systemic response secondary to this  cellulitis and ulceration.  Otherwise, she was found to be stable.   Her blood pressure was noted to be in the 140s with no elevations higher  since she has been here.  The patient is doing otherwise okay.  She  denies any headaches, vision changes, dysphagia, chest pain,  palpitations, shortness of breath, wheeze, cough, abdominal pain,  hematuria, dysuria, constipation, diarrhea, focal extremity weakness or  pain.  She has chronic lower extremity numbness from neuropathy.  Review  of systems is otherwise negative.   PAST MEDICAL HISTORY:  Includes diabetes mellitus type 2, poorly  controlled with secondary neuropathy.   MEDICATIONS:  The patient is on metformin 500 p.o. b.i.d.,  glimepiride  8 p.o. daily, Lipitor 10 p.o. daily, lisinopril/HCTZ 20/12.5 p.o. daily,  Vicodin p.r.n., aspirin 81 p.o. daily.  ALLERGIES:  She is ALLERGIC TO PENICILLIN.   SOCIAL HISTORY:  No tobacco, alcohol, or drug use.   FAMILY HISTORY:  Noncontributory.   PHYSICAL EXAMINATION:  Patient's vitals on admission, temperature 98.6  and T/max 100.1, heart rate 149 now down to 125, blood pressure 142/72,  respiration 18.  O2 saturations 98% on room air.  In general she is  alert and oriented x3.  She does have some slurred speech, but cranial  nerves II-XII are intact.  HEENT normocephalic atraumatic.  Mucous  membranes are slightly dry.  She has no carotid bruits.  Heart is a  regular rhythm.  Mild tachycardia, 2/6 systolic ejection murmur.  Lungs  are clear to auscultation bilaterally.  Abdomen is soft, obese,  nontender.  Positive bowel sounds.  Extremities show no clubbing,  cyanosis, trace pitting edema on signs of peripheral neuropathy.  Her  right foot the medial aspect to her first toe she has a slightly larger  than quarter-sized lesion, which looks to be chronic.  She also has some   1+ pitting edema of her right leg from mid calf down, which is warm to  touch and erythematous when compared to her left foot.   LABORATORY DATA:  White count 21.8, H and H 13.4 and 39, MCV is 92,  platelet count 221, 90% neutrophils, sodium 130, potassium 2.9, chloride  95, bicarb 25, BUN 15, creatinine 1.01, glucose 208.  LFTs are  unremarkable.  UA unremarkable.  Blood cultures drawn and pending.   X-RAY:  Chest x-ray is as per HPI.   ASSESSMENT AND PLAN:  1. Cellulitis, rule out osteomyelitis, check MRI, put the patient on      IV vanco and Avelox, as well as get a wound-care consult.  2. Confusion some residual slurred speech.  Certainly with a history      of diabetes she is at risk for CAD, or CAD equivalent with stroke.      Will check an MRI of the head, continue aspirin.  3. Tachycardia early systemic response, secondary to cellulitis.  4. Diabetes mellitus, check hemoglobin A1c plus sliding scale and      medicines.      Hollice Espy, M.D.  Electronically Signed     SKK/MEDQ  D:  01/23/2007  T:  01/24/2007  Job:  [id number]   cc:   Copy to place on chart   Wanda K. Fabian Sharp, MD  [address] [place], [place] [address]

## 2010-08-24 NOTE — H&P (Signed)
Natasha West, BRAUD NO.:  [id number]   MEDICAL RECORD NO.:  [id number]          PATIENT TYPE:  EMS   LOCATION:  ED                           FACILITY:  [facility]   PHYSICIAN:  Michiel Cowboy, MDDATE OF BIRTH:  [birth year]   DATE OF ADMISSION:  03/31/2008  DATE OF DISCHARGE:                              HISTORY & PHYSICAL   PRIMARY CARE PHYSICIAN:  Neta Mends. Panosh, MD.   CHIEF COMPLAINT:  Right toe infection.   The patient is a 77 year old female with history of diabetes and  diabetic neuropathy, who was sent here by her podiatrist secondary to  severe fourth toe infection.  She was started on some p.o. antibiotic,  the name of which she cannot recollect; but stopped taking it because it  upset her stomach.  Her fourth right toe had turned black and foul  smelling, with a lot of discharge.  She also has a large ulcerated  callous on the bottom of her right foot.  Said that per her podiatrist  it looks fairly well.   Otherwise, the patient denies any fevers, no chest pain or shortness of  breath.  No dyspnea on exertion.  No nausea, no vomiting.  No diarrhea.  No constipation.  Otherwise review of systems was unremarkable.   PAST MEDICAL HISTORY:  1. History of diabetes.  2. Neuropathy, secondary to diabetes.  3. History of chronic right foot large callous, with history of      cellulitis in the past.   SOCIAL HISTORY:  The patient used to smoke, but quit.  Has smoked for 25  years total.  Does not drink alcohol.  Lives at home with her husband;  walks with a special shoe provided for her right foot.   FAMILY HISTORY:  Noncontributory.   ALLERGIES:  Allergic to PENICILLIN.   MEDICATIONS:  1. Metformin 500 mg daily.  2. Glimepiride 8 mg daily.  3. Lipitor 10 mg daily.  4. Lisinopril/hydrochlorothiazide 20/12.5 mg p.o. daily.  5. Vicodin as needed for pain.  6. Aspirin 81 mg p.o. daily.   PHYSICAL EXAMINATION:  VITALS: Temperature 97.8, blood  pressure 136/71,  pulse 96, respirations 20, saturations 100% on room air.  The patient  appears to be in no acute distress.  She has very poor sensation of her  lower extremities bilaterally.  HEENT:  Head nontraumatic.  Somewhat dryish mucous membranes, but normal  skin turgor.  LUNGS:  Clear to auscultation bilaterally.  HEART: Regular rate and rhythm.  No murmurs, rubs or gallops.  ABDOMEN: Obese; but soft, nontender, nondistended.  LOWER EXTREMITIES: Without clubbing, cyanosis or edema.  Pulses palpable  bilaterally.  Right foot with a gangrenous fourth toe, with some redness  and also redness over the first toe as well.  Also a large, about 3 cm,  callous under the first metatarsal head.   LABS:  White blood cell count 9.0, hemoglobin 11.9, sodium 137,  potassium 4.1, creatinine 0.97, blood sugar 187.  Albumin 3.3.  UA  within normal limits.  Plain films showing bone destruction of the  distal phalanx of her  fourth toe.   ASSESSMENT/PLAN:  This is a 77 year old with diabetes-caused  osteomyelitis of her fourth toe, with apparent gangrene.   1. Possible gangrene and osteomyelitis of right fourth toe and      diabetic.  Will need an orthopedic consult for possible debridement      and amputation.  Will start on broad-spectrum antibiotics.  The      patient is PENICILLIN allergic.  We will start on vancomycin,      Flagyl and Cipro for  multisource coverage.  Will continue diabetic      control and pain management.  Will also order consult for callous      of the same foot.  2. Diabetes:  Hold metformin and p.o. medication while in-house.  We      will make sure she is on Lantus and sliding scale.  Will titrate      Lantus as needed for appropriate glucose control while inpatient.   PROPHYLAXIS:  Protonix and SCDs, in case the patient may need  intervention.  Would defer to Orthopedics, if they feel comfortable for  Lovenox prophylaxis.  For now, hold aspirin until orthopedics  sees the  patient.      Michiel Cowboy, MD  Electronically Signed     AVD/MEDQ  D:  03/31/2008  T:  03/31/2008  Job:  [id number]   cc:   Neta Mends. Fabian Sharp, MD  [address] [place], [place] [address]   Raenette Rover. Felicity Coyer, MD  [address] [place], [place] [address]

## 2010-08-24 NOTE — Op Note (Signed)
Natasha West, Natasha West               ACCOUNT NO.:  [id number]   MEDICAL RECORD NO.:  [id number]          PATIENT TYPE:  INP   LOCATION:  5021                         FACILITY:  [facility]   PHYSICIAN:  Harvie Junior, M.D.   DATE OF BIRTH:  11-19-1927   DATE OF PROCEDURE:  DATE OF DISCHARGE:  06/23/2008                               OPERATIVE REPORT   PREOPERATIVE DIAGNOSES:  1. Open distal interphalangeal joint of the right great toe with      chronic drainage, status post prolonged nonoperative treatment.  2. Gangrenous distal phalanx of the right great toe.   POSTOPERATIVE DIAGNOSES:  1. Open distal interphalangeal joint of the right great toe with      chronic drainage, status post prolonged nonoperative treatment.  2. Gangrenous distal phalanx of the right great toe.   PRINCIPAL PROCEDURES:  1. Right great ray amputation.  2. Right distal phalanx amputation.   SURGEON:  Harvie Junior, MD   ASSISTANT:  Marshia Ly, PA   ANESTHESIA:  General.   BRIEF HISTORY:  The patient is a 77 year old female with long history of  having had a significant problems with the right foot.  She had had  previous fourth toe amputation.  She began having open draining DIP  joint some prolonged period time ago and she was evaluated by her  podiatrist who had been treating her with dressing changes and  nonoperative care.  She persisted in draining.  She was admitted to the  hospital because of concerns of an infection and we were consulted  thorough a consult note on this patient, but ultimately basically said  that I did not think the wound that was unstable, but it was clearly  never going to heal with opened joint with continued drainage of joint  fluid from the wound.  She did have a gangrenous tip of the second toe.  We felt this was only going to give worse.  We talked her about  treatment options and felt that she needed to have a great ray  amputation and an amputation of her second toe.   She was worked up with  ABIs and evaluated by the medical service and once this felt that she  was stable for this, she was taken to the operating room for these  procedures.   PROCEDURE:  The patient was taken to operating room.  After adequate  anesthesia obtained with general anesthetic the patient was placed  supine on the operating table.  The right lower extremity was prepped  and draped in sterile fashion.  Following this, attention was turned to  the great ray where the entire necrotic-appearing wound was ellipsed and  a great ray amputation was performed.  We used a saw to bevel the great  metatarsal near the metatarsocuneiform base.  Once this was completed,  the skin was evaluated, it was noted to close, reasonably well.  We put  a carpal retention stitch and then disclosed with interrupted stitches  over a drain.  Attention was then turned to the second toe where an  elliptical fish mouth-type incision  was made over the distal phalanx and  the distal phalanx was then excised.  The wound was irrigated and the  skin closed in layers.  The sterile compressive dressing was applied at  this time.  The tourniquet was let down after a brief period of time to  evaluate the bleeding.  There was edge bleeding and no significant  problems with bleeding.  Hemostasis was controlled with electrocautery.  Wound was irrigated, suctioned dry, closed with just nylon sutures.  Sterile compressive dressing was applied and the patient was taken to  recovery room was noted to be in satisfactory condition.  Estimated  blood loss for this procedure was less than 25 mL.  Of note, Marshia Ly assisted throughout the case, it is critical on the performance  of the case.      Harvie Junior, M.D.  Electronically Signed     JLG/MEDQ  D:  06/25/2008  T:  06/26/2008  Job:  [id number]

## 2010-08-27 NOTE — Op Note (Signed)
NAME:  KYNDALL, AMERO                         ACCOUNT NO.:  [id number]   MEDICAL RECORD NO.:  [id number]                   PATIENT TYPE:  OIB   LOCATION:  5012                                 FACILITY:  [facility]   PHYSICIAN:  Dionne Ano. Everlene Other, M.D.         DATE OF BIRTH:  [birth year]   DATE OF PROCEDURE:  12/02/2003  DATE OF DISCHARGE:                                 OPERATIVE REPORT   PREOPERATIVE DIAGNOSIS:  Fracture-dislocation, left elbow, with comminuted  ulna and radial head.  This is malreduced and very comminuted with complex  intra-articular derangement, including coronoid abnormality.   POSTOPERATIVE DIAGNOSES:  1. Fracture-dislocation, left elbow, with comminuted ulna and radial head.     This is malreduced and very comminuted with complex intra-articular     derangement, including coronoid abnormality.  2. Noted right radial neck fracture, which will be treated closed.   SURGICAL PROCEDURES PERFORMED:  1. Open reduction and internal fixation, left elbow fracture-dislocation,     including a long-hole ulna plate and open reduction and internal fixation     separately of a coronoid fragment.  This is a segmental ulna fracture,     which was bone-grafted with autologous bone graft obtained from the     radial head.  2. Radial head resection and arthroplasty using an [employer] size 22 long-     stem implant.  3. Stress radiography.  4. Fluoroscopy, right upper extremity, with noted findings of a radial     head/neck fracture about the elbow, which will be treated conservatively.   SURGEON:  Dionne Ano. Amanda Pea, M.D.   ASSISTANT:  Karie Chimera, P.A.-C.   ANESTHESIA:  General.   ESTIMATED BLOOD LOSS:  Minimal.   TOURNIQUET TIME:  Two hours.   DRAINS:  None.   INDICATION FOR PROCEDURE:  The patient is a very pleasant female who  presents with a fracture-dislocation of the left elbow.  This is in very  poor alignment, comminuted, complex, and is going to require  ORIF, radial  head arthroplasty, and possible external fixation.  She understands the  risks, benefits, and alternatives, including bleeding, infection,  anesthesia, damage to normal structures, and failure of surgery to  accomplish its intended goals of relieving symptoms and restoring function.  With this in mind, she desires to proceed.  All questions have been  encouraged and answered preoperatively.   The patient also has complained of right upper extremity pain over the last  six hours or so, and thus we are going to plan fluoroscopy to rule out any  fractures here as well.   OPERATION IN DETAIL:  The patient was seen by myself and anesthesia and  counseled in the holding area.  She was given preoperative vancomycin.  Labs  were checked.  A permit was signed.  She was then taken to the operative  suite and underwent a smooth induction of general anesthesia.  Once under  anesthesia, a  Foley catheter was inserted.  This will be maintained  postoperatively.  Following this she was laid in a modified lateral  position, appropriately padded about all pressure points, and was prepped  and draped in the usual sterile fashion about the left upper extremity.  Following this and securing the sterile field with Ioban and stockinette,  the patient had an incision made under 250 mm tourniquet control  posteriorly.  Dissection was carried down.  I was able to access the  posterior aspect of the elbow.  She had a comminuted, complex ulna fracture.  We performed irrigation.  At this point in time a very comminuted radial  head fracture was removed.  This was in four pieces.  All pieces were  removed, assembled on a back table and sized to a 22 size for the [employer]  implant, which would be placed later.  Following this I irrigated copiously  and I removed blood clot and debris about the elbow joint.  This was  dislocated.  I identified two more anterior pieces, one in the coronoid and  another  large piece of corticocancellous bone, which was attached to the  main shaft and metaphyseal flare of the ulna.  Fibrewire sutures were placed  around both of these anterior pieces, including portions of the capsule  where the coronoid was attached to.  Following this, the patient underwent  reduction of the ulna shaft fracture, provisional fixation was obtained with  Kirschner wire, and following this an AccuMed place was placed without  difficulty.  I was able to secure the coronoid fragment through drill holes  in the olecranon itself.  Thus, the coronoid was fixated by placing #2  Fibrewire through it and following this, these were threaded through the  olecranon and tied down over the posterior region of the olecranon in place.  This reduced the coronoid satisfactorily.  Following this, the remainder of  the ulna was reduced.  The anterior piece was purchased as well, and I was  pleased to this.  In addition to this, Fibrewire suture which was placed  through the bone using a 0.062 K-wire was tied down as well to the plate and  screw construct.  I bone-grafted this with bone graft obtained from the  radial head.  Following placement of the plate and screw construct and ORIF  of the coronoid and segmental fracture, we then performed a radial head  arthroplasty.  The radial head was previously removed.  We then performed an  oscillating saw bevel cut.  It was apparent a longer stem type apparatus  would need to be used, so we used a size 22 long radial head implant.  Trial  was performed, and excellent range of motion was obtained and the patient  then had placement of the [employer]ension size 22 long stem component.  This  seated nicely, the cuts looked excellent, and I was pleased with this.  Following this I then closed the annular ligament and capsular regions with  #2 Fibrewire.  The elbow was taken through a full range of motion and was stable.  I was quite pleased with this ligamentously  and at this time felt  that the patient did not need an external fixator, as she was quite stable  at 30 degrees extension and, of course, had full flexion and full pronation  and supination.  The tourniquet was then deflated, hemostasis was obtained.  Copious irrigation was applied and the patient then underwent final stress  radiography, and pictures  were taken.  Once this was done, the wound was  closed in layers.  Vicryl 0, followed by 2-0 Vicryl, followed by a staple  gun at the skin edge was placed.  She had excellent pulse, soft  compartments, no signs of dystrophy, DVT, or other problems at this  juncture.  A sterile dressing was applied, a long-arm splint was placed with  the arm in a pronated position, and a Leavy Cella bridge was placed.  The patient  tolerated this well.  She was placed in 90 degrees.  I did perform x-rays in  the splint, which showed her reduced.  Following this the right upper  extremity was completely x-rayed, including hand, wrist, forearm, elbow, and  humerus.  This revealed a radial neck fracture, which we will treat  conservatively, and I placed this through a range of motion and stress  testing and felt that this would be stable for conservative management.  Once this was done, this was placed in a sling and will be managed  conservatively.  She awoke from anesthesia, was transferred to the recovery  room, and will be monitored  closely.  Will continue vancomycin, pain management, IV fluid, Foley  catheter will be discontinued once stable, and we will get her up and  mobilize her as soon as possible.  I have discussed with her do's and  don'ts, etc., and all questions have been encouraged and answered.                                               Dionne Ano. Everlene Other, M.D.    Nash Mantis  D:  12/02/2003  T:  12/03/2003  Job:  [id number]   cc:   Neta Mends. Fabian Sharp, M.D. [facility]   Ronald A. Darrelyn Hillock, M.D.  [facility]  [address]  [address]   [place]  [place] [address]  Fax: [phone]

## 2010-08-27 NOTE — Discharge Summary (Signed)
NAME:  Natasha West, Natasha West                         ACCOUNT NO.:  [id number]   MEDICAL RECORD NO.:  [id number]                   PATIENT TYPE:  ORB   LOCATION:  4532                                 FACILITY:  [facility]   PHYSICIAN:  Rene Paci, M.D. [facility]          DATE OF BIRTH:  June 09, 1927   DATE OF ADMISSION:  12/08/2003  DATE OF DISCHARGE:  12/12/2003                                 DISCHARGE SUMMARY   DISCHARGE DIAGNOSES:  1.  Status post left elbow fracture.  2.  Status post open reduction internal fixation.  3.  Right right elbow fracture.  4.  Diarrhea.  5.  Failure to thrive.  6.  Debilitation.   BRIEF ADMISSION HISTORY:  [name] is a 77 year old white female who fell  on December 02, 2003.  She sustained bilateral elbow fractures.  She was  admitted by orthopedics and underwent an ORIF of the left ulna.  Post-op the  patient was quite debilitated.  She was transferred to the subacute rehab  unit for further rehab prior to being discharged home.   PAST MEDICAL HISTORY:  1.  Adult onset diabetes mellitus.  2.  Status post appendectomy.  3.  Osteoarthritis.  4.  Status post right total knee replacement.  5.  Hyperlipidemia.  6.  Peripheral neuropathy.  7.  Hypertension.  8.  Hypercholesterolemia.   HOSPITAL COURSE:  1.  Debilitation.  The patient was transferred after a fall sustaining      bilateral elbow fractures.  The patient has progressed with therapy to      the point at this time she is stable for discharge.  2.  GI.  The patient's subacute hospital course was complicated by loose      stools.  We suspect this was secondary to laxative she was using;      however, we did stool for C. diff toxin as she was on vancomycin during      her acute hospitalization.  Stools for C. diff toxin are not back but at      this time there is no evidence of ongoing infection.  3.  Adult onset diabetes mellitus.  The patient has had some hyperglycemia      during her subacute stay,  but we will defer any adjustment in her      medication to her primary care physician.   LABS AT DISCHARGE:  Potassium was 3, BUN 12, creatinine 0.8.  CBC was  normal.   MEDICATIONS AT DISCHARGE:  1.  HCTZ 25 mg one half tab every day.  2.  Amaryl 4 mg b.i.d.  3.  Altace 10 mg every day.  4.  Lipitor 10 mg every day.  5.  Toprol XL 50 mg every day.  6.  Aspirin 325 mg every day.  7.  Vicodin one tab q.6h. p.r.n.   FOLLOW UP:  Follow up with Dr. Fabian Sharp in 2-3 weeks, at that time  she should  have further evaluation of her diabetic regimen.      Cornell Barman, P.A. [facility]                  Rene Paci, M.D. [facility]    LC/MEDQ  D:  12/12/2003  T:  12/13/2003  Job:  [id number]   cc:   Neta Mends. Fabian Sharp, M.D. [facility]   Ronald A. Darrelyn Hillock, M.D.  [facility]  [address]  [address]  [place]  [place] [address]  Fax: [phone]

## 2010-08-27 NOTE — Consult Note (Signed)
NAME:  Natasha West, Natasha West                         ACCOUNT NO.:  [id number]   MEDICAL RECORD NO.:  [id number]                   PATIENT TYPE:  OIB   LOCATION:  5012                                 FACILITY:  [facility]   PHYSICIAN:  Charlton Haws, M.D.                  DATE OF BIRTH:  03/16/1928   DATE OF CONSULTATION:  12/03/2003  DATE OF DISCHARGE:                                   CONSULTATION   PHYSICIANS:  Consulting physician:  Charlton Haws, M.D.  Primary care physician: Neta Mends. Panosh, M.D.   We were called in to consult on this 77 year old Caucasian female with new  onset sinus tachycardia by EKG.  The patient is status post fall with  bilateral fractures arm requiring open reduction and internal fixation of  left arm.  Right arm presently in sling.  EKG totally done prior to our  arrival showed sinus tachycardia, rate of 140.  Currently, sinus rhythm  sinus tachycardia rate of 100.  The patient denies any chest discomfort at  any time since onset of sinus tachycardia, just complaint of mild nausea and  pain in bilateral arms related to surgical site.  Family with patient, also  states patient has not complained of any chest pain to them, nor shortness  of breath, or diaphoresis.   PAST MEDICAL HISTORY:  Negative for cardiac catheterization, negative for  bypass.  The patient states she does have history of stress test done prior  to her total knee done in 1999.  She states that the stress test was  completely normal at that time.   ALLERGIES:  PENICILLIN, and MORPHINE causes severe nausea.   MEDICATIONS AT HOME:  1. HCTZ 12.5 mg.  2. Amaryl 4 mg p.o. bi d  3. Altace.  4. Lipitor.  5. Vicodin p.r.n.   MEDICATIONS IN HOSPITAL SETTING:  1. Vancomycin.  2. Vicodin p.r.n.  3. Robaxin.  4. Restoril.  5. Benadryl.  6. Hydrochlorothiazide.  7. Amaryl.  8. Altace.  9. Lipitor.  10.      Multivitamins.  11.      Peri-Colace.   PAST MEDICAL HISTORY:  1. Type 2 diabetes.  2.  Peripheral neuropathy related to the diabetes.  3. Hypertension.  4. Hypercholesterolemia followed by her family primary care physician.   SOCIAL HISTORY:  The patient lives in [place] with her husband.  She is  retired.  She has adult children, daughter alive and well and present with  patient now, and an adult son with diabetes.  Denies any tobacco use.  Denies any exercise due to her orthopedic problems. Denies any ETOH  problems, does follow a low-sugar diet.  Denies any drug or herbal  medication use.   FAMILY HISTORY:  She states her mother is deceased due to Alzheimer's.  Father deceased at age 39 due to MI, and patient states she has no siblings.   REVIEW OF SYSTEMS:  Complains of generalized weakness postop.  Also myalgia  and arthralgia and pain related to surgical site.  Complains of mild nausea.   PHYSICAL EXAMINATION:  VITAL SIGNS:  T-max. 101.3 earlier today, temperature  currently 98, pulse 100, respirations 20, blood pressure 154/81.  O2  saturation 96% on 2 liters.  GENERAL:  Alert and oriented, in no acute distress.  NECK:  Supple without lymphadenopathy.  Negative bruit, negative for JVD.  CARDIOVASCULAR:  Heart rate regular rhythm, S1 and S2.  Pulses equal 2+.  LUNGS:  Clear to auscultation.   LABORATORY AND X-RAY DATA:  EKG shows rate of 100, normal sinus rhythm, with  a PR of 142, QRS 82, and QTC of 410.   WBC 9.9, hemoglobin 11.3, hematocrit 32.2, platelets 224.  Sodium 135,  potassium 4.0, chloride 102, CO2 23, BUN 14, creatinine 1.1, glucose 201.  Initial CK-MB 18.6 with troponin of 1.16.  CK total is 654, and index is  2.8.   IMPRESSION:  1. New onset sinus tachycardia, questionable etiology.  Possibly related to     fever of 101.3, also status post open reduction and internal fixation     left elbow, possibly pulmonary embolus cannot be ruled out currently.  2. Elevated troponins.  Once again could be due to pulmonary embolus or     sinus  tachycardia, increased work load on heart.  3. Status post open reduction and internal fixation left elbow.  4. Hypertension.  5. Diabetes.  6. Hypercholesterolemia.  7. Obesity.  8. Family history.   PLAN:  1. We will go ahead and repeat her cardiac enzymes and do echocardiogram at     bedside.  2. Can continue with transfer to telemetry.  3. Will hold on heparin drip for now.  4. Continue Lopressor, beta blocker, slow down heart rate which is     controlled currently.  5. Get a CT of the chest.  Rule out PE.  6. Dr. Eden Emms evaluate and agree with plan.  7. Will continue to follow the patient.     Dorian Pod, NP                       Charlton Haws, M.D.    MB/MEDQ  D:  12/03/2003  T:  12/03/2003  Job:  [id number]   cc:   Neta Mends. Fabian Sharp, M.D. [facility]

## 2010-08-27 NOTE — Discharge Summary (Signed)
NAME:  Natasha West, Natasha West                         ACCOUNT NO.:  [id number]   MEDICAL RECORD NO.:  [id number]                   PATIENT TYPE:  INP   LOCATION:  5022                                 FACILITY:  [facility]   PHYSICIAN:  Dionne Ano. Amanda Pea, M.D.             DATE OF BIRTH:  [birth year]   DATE OF ADMISSION:  12/02/2003  DATE OF DISCHARGE:  12/08/2003                                 DISCHARGE SUMMARY   ADMISSION DIAGNOSIS:  1. Fracture dislocation of the left elbow with a comminuted ulna and radial     head.  2. History of type 2 diabetes mellitus.  3. History of peripheral neuropathy related to the diabetes.  4. History of hypertension.  5. History of hypercholesterolemia.  6. History of obesity.   DISCHARGE DIAGNOSIS:  1. Fracture dislocation of the left elbow with a comminuted ulna and radial     head.  2. History of type 2 diabetes mellitus.  3. History of peripheral neuropathy related to the diabetes.  4. History of hypertension.  5. History of hypercholesterolemia.  6. History of obesity.  7. New onset of sinus tachycardia.  8. Right radial neck fracture.   CONSULTATIONS:  Charlton Haws, M.D.  Rene Paci, M.D. [facility]   PROCEDURE:  1. Open reduction and internal fixation left elbow.  2. Conservative treatment of right radial neck fracture.   BRIEF HISTORY:  Ms. Finnigan is a pleasant 77 year old female who,  unfortunately, sustained a fracture dislocation about the left elbow after  falling on a set of steps.  He was originally seen and evaluated at the  [facility] for her initial injury and given the trauma she  sustained, would require surgical intervention.  Her past medical history  was noted.  Her primary care physician, Dr. Fabian Sharp, was contacted for  medical clearance.  Once this was obtained, she was admitted to [facility].   LABORATORY DATA:  Hemoglobin 13.9, hematocrit 40, otherwise, within normal  limits.  PT, PTT, and INR were  within normal limits.  Glucose was noted to  be 201, otherwise, complete metabolic panel was within normal limits.  Chest  x-ray is currently not in the chart.   HOSPITAL COURSE:  Ms. Sangster was admitted on December 02, 2003, and underwent  open reduction and internal fixation of the left elbow per Dr. Amanda Pea.  She  was noted, after interoperative fluoroscopy, to have a left radial neck  fracture which was treated conservatively, please see operative report for  full details.  The patient was admitted for IV antibiotics, elevation,  observation, and pain management.  On August 23, in the late evening, the  patient was noted to have onset of sinus tachycardia.  Dr. Eden Emms, on call  for Dr. Fabian Sharp group, was contacted and appropriate cardiogenic workup was  performed as well as CE workup.  The patient was noted to have the new onset  of  sinus tachycardia with elevated cardiac enzymes, however, she was  asymptomatic in terms of chest pain, diaphoresis, radiation to the left  upper extremity, etc.  CT of the chest was ordered which showed no signs of  a pulmonary embolus.  The patient was transferred to telemetry and started  on Lopressor for weight control.  Her condition improved quite rapidly and  the following day, she was transferred from telemetry to the orthopedic unit  as her condition was stable.  Medicine followed throughout her hospital  course and recommended outpatient follow up in terms of her recent onset  sinus tachycardia as currently she was stable.  She continued to improve in  terms of her pain control.  She had a T-max of 101.3 on December 04, 2003.  This slowly trended to normalcy.  The patient's overall condition continued  to improve.  She continued IV antibiotics and judicious use of pain  medications.  Her upper extremity sensation remained intact, range of motion  improved daily, and discharge plans were considered.  Given her lack of  social support at home, a discussion  of rehabilitation was discussed with  the patient.  SACU was certainly a viable option.  She was noted on postop  day three to have complaints of dysuria.  A UA was checked and was positive  for nitrites and positive leukocytes with rare bacteria noted.  She was  treated for a UTI with Cipro for three days as well as Pyridium for the  dysuria.  She was improved in terms of her symptoms.  On December 08, 2003,  she was doing very well.  Temperature 99, blood pressure 134/70,  respirations 20, pulse 83.  She had no chest pain, shortness of breath,  nausea, or vomiting, she had minimal pain to the left or right upper  extremity.  She was able to utilize her right upper extremity for activities  of daily living throughout her hospital course.  A bed became available in  the SACU and under the direction of Dr. Felicity Coyer, hospitalist, she will be  admitted for [facility].  We will follow her in S[facility]U in terms of her orthopedic  issues and certainly appreciate Dr. Diamantina Monks help in managing this patient  medically while in [facility]U.  She will continue her current pain medication  regimen, elevation, and activities.   CONDITION ON DISCHARGE:  Improved.   DISCHARGE INSTRUCTIONS:  Diet is a diabetic diet.  Activities:  She will  keep her left upper extremity elevated.  We will encourage range of motion  to the fingers.  She will be non-weight bearing to the left upper extremity.  She will continue massage and edema control.  She can certainly use passive  assistance to assist her with activities of daily living.  Continue strict  use of incentive spirometry.   We will continue her current medication regimen.  The patient was noted to  be somewhat noncompliant with sliding scale insulin and refused this on  several occasions.  Her current medication regimen is hydrochlorothiazide  12.5 mg 1 p.o. daily, Amaryl 4 mg 1 p.o. b.i.d., Altace 10 mg 1 p.o. daily, Zocor 20 mg 1 p.o. daily, multi-vitamin one daily,  Senokot 1 p.o. b.i.d.,  aspirin 325 mg p.o. daily, Toprol XL 50 mg 1 p.o. daily.  Her Cipro will be  discontinued after three days which will be on August 29.  Dilaudid 2 mg IV  q.3-4h. p.r.n. severe pain.  Robaxin 5 mg 1 p.o. q.6h., Phenergan 25  mg 1  p.o. q.6h. p.r.n., Restoril 15-30 mg 1 p.o. q.h.s. p.r.n., Benadryl 25 mg to  50 mg 1 p.o. q.6h., Tylenol 500 mg p.o. q.8h., Vicodin 1-2 tabs 1 p.o. q.4-  6h. p.r.n., artificial tears as needed.   Follow up will be in one weeks time after discharge from SACU with Dr.  Amanda Pea.  She is to call [phone] to schedule.  Again, we will continue her  follow up while the patient is in [place].      Karie Chimera, P.A.-C.                   Dionne Ano. Amanda Pea, M.D.    BB/MEDQ  D:  12/08/2003  T:  12/08/2003  Job:  [id number]   cc:   Neta Mends. Fabian Sharp, M.D. [facility]   Charlton Haws, M.D.   Rene Paci, M.D. [facility]  [address] [place], [place] [address]

## 2010-10-10 HISTORY — PX: OTHER SURGICAL HISTORY: SHX169

## 2010-10-11 ENCOUNTER — Encounter: Payer: Medicare Other | Admitting: Surgery

## 2010-10-12 ENCOUNTER — Ambulatory Visit: Payer: Medicare Other | Admitting: Vascular Surgery

## 2010-10-12 ENCOUNTER — Encounter (INDEPENDENT_AMBULATORY_CARE_PROVIDER_SITE_OTHER): Payer: Medicare Other | Admitting: Vascular Surgery

## 2010-10-12 ENCOUNTER — Encounter (INDEPENDENT_AMBULATORY_CARE_PROVIDER_SITE_OTHER): Payer: Medicare Other

## 2010-10-12 DIAGNOSIS — L97509 Non-pressure chronic ulcer of other part of unspecified foot with unspecified severity: Secondary | ICD-10-CM

## 2010-10-12 DIAGNOSIS — L98499 Non-pressure chronic ulcer of skin of other sites with unspecified severity: Secondary | ICD-10-CM

## 2010-10-14 NOTE — Consult Note (Signed)
NEW PATIENT CONSULTATION  West, Natasha A DOB:  1927/12/13                                       10/12/2010 ZOXWR#:[id number]  Patient is an 77 year old female patient who has had multiple toe amputations in the right lower extremity.  Recently had a new insert made for her right foot and states that after that developed an ulceration on the tip of her third toe.  She previously had the right first and second toes amputated in 2010, the third toe amputated in 2012 on the tip.  This was done by an orthopedic surgeon who she does not recall the name.  This has continued to be irritated with no evidence of any healing.  She was referred for vascular evaluation.  CHRONIC MEDICAL PROBLEMS: 1. Diabetes type 2. 2. Hyperlipidemia. 3. Degenerative joint disease.  Previous left total knee arthroplasty     for peripheral neuropathy. 4. Negative for coronary artery disease, COPD or stroke.  SOCIAL HISTORY:  She is married, has 4 children, is retired.  Has not smoked in 20 years.  Does not use alcohol.  FAMILY HISTORY:  Positive for coronary artery disease in her father, who died of a myocardial infarction.  REVIEW OF SYSTEMS:  Positive for dizziness, headaches, arthritis, joint pain, instability with ambulation, currently walks with a walker.  All other systems are negative in a complete review of systems.  PHYSICAL EXAMINATION:  Blood pressure 103/56, heart rate 112, respirations 14.  General:  She is an elderly female in no apparent distress, alert and oriented x3.  HEENT:  Normal for age.  EOMs intact. Lungs:  Clear to auscultation.  No rhonchi or wheezing.  Cardiovascular: Regular rhythm.  No murmurs.  Carotid pulses are 3+.  No audible bruits. Abdomen:  Soft, nontender with no masses.  Musculoskeletal:  Free of major deformities.  Neurologic exam reveals a decreased sensation in the feet.  Skin exam reveals an ulceration on the tip of the remaining third toe of  the right foot.  There is some mild erythema.  No fluctuans or purulent drainage.  Lower extremity exam reveals 3+ femoral, popliteal, and dorsalis pedis pulses palpable bilaterally and 2+ posterior tibial pulses.  Today I ordered lower extremity arterial duplex exam which I have reviewed and interpreted.  ABIs are essentially normal bilaterally, 0.94 on the right and 1.0 on the left with good waveforms.  This patient does not have significant arterial insufficiency, and I think the best plan would be amputating the remaining toes that has the ulceration on the tip, which will also make orthotics easier for this patient.  I would recommend having her see an orthopedic surgeon to perform this in the near future, and hopefully it will heal since the circulation does seem adequate.    Quita Skye Hart Rochester, M.D. Electronically Signed  JDL/MEDQ  D:  10/12/2010  T:  10/14/2010  Job:  [id number]  cc:   Neta Mends. Fabian Sharp, MD Ezequiel Kayser. Harriet Pho, D.P.M.

## 2010-10-24 ENCOUNTER — Inpatient Hospital Stay (HOSPITAL_COMMUNITY)
Admission: EM | Admit: 2010-10-24 | Discharge: 2010-10-30 | DRG: 042 | Disposition: A | Discharge status: Skilled Nursing Facility | Payer: Medicare Other | Attending: Internal Medicine | Admitting: Internal Medicine

## 2010-10-24 ENCOUNTER — Emergency Department (HOSPITAL_COMMUNITY): Payer: Medicare Other

## 2010-10-24 DIAGNOSIS — M1990 Unspecified osteoarthritis, unspecified site: Secondary | ICD-10-CM | POA: Diagnosis present

## 2010-10-24 DIAGNOSIS — E869 Volume depletion, unspecified: Secondary | ICD-10-CM | POA: Diagnosis present

## 2010-10-24 DIAGNOSIS — M2040 Other hammer toe(s) (acquired), unspecified foot: Secondary | ICD-10-CM | POA: Diagnosis present

## 2010-10-24 DIAGNOSIS — E785 Hyperlipidemia, unspecified: Secondary | ICD-10-CM | POA: Diagnosis present

## 2010-10-24 DIAGNOSIS — I10 Essential (primary) hypertension: Secondary | ICD-10-CM | POA: Diagnosis present

## 2010-10-24 DIAGNOSIS — S98139A Complete traumatic amputation of one unspecified lesser toe, initial encounter: Secondary | ICD-10-CM

## 2010-10-24 DIAGNOSIS — S98119A Complete traumatic amputation of unspecified great toe, initial encounter: Secondary | ICD-10-CM

## 2010-10-24 DIAGNOSIS — E1142 Type 2 diabetes mellitus with diabetic polyneuropathy: Secondary | ICD-10-CM | POA: Diagnosis present

## 2010-10-24 DIAGNOSIS — I959 Hypotension, unspecified: Secondary | ICD-10-CM | POA: Diagnosis present

## 2010-10-24 DIAGNOSIS — I498 Other specified cardiac arrhythmias: Secondary | ICD-10-CM | POA: Diagnosis present

## 2010-10-24 DIAGNOSIS — D638 Anemia in other chronic diseases classified elsewhere: Secondary | ICD-10-CM | POA: Diagnosis present

## 2010-10-24 DIAGNOSIS — R339 Retention of urine, unspecified: Secondary | ICD-10-CM | POA: Diagnosis present

## 2010-10-24 DIAGNOSIS — L97509 Non-pressure chronic ulcer of other part of unspecified foot with unspecified severity: Secondary | ICD-10-CM | POA: Diagnosis present

## 2010-10-24 DIAGNOSIS — E1149 Type 2 diabetes mellitus with other diabetic neurological complication: Principal | ICD-10-CM | POA: Diagnosis present

## 2010-10-24 DIAGNOSIS — E0781 Sick-euthyroid syndrome: Secondary | ICD-10-CM | POA: Diagnosis present

## 2010-10-24 LAB — CBC
HCT: 41.6 % (ref 36.0–46.0)
Hemoglobin: 14.3 g/dL (ref 12.0–15.0)
MCH: 30.6 pg (ref 26.0–34.0)
MCV: 89.1 fL (ref 78.0–100.0)
RBC: 4.67 MIL/uL (ref 3.87–5.11)

## 2010-10-24 LAB — URINALYSIS, ROUTINE W REFLEX MICROSCOPIC
Glucose, UA: NEGATIVE mg/dL
Hgb urine dipstick: NEGATIVE
Protein, ur: NEGATIVE mg/dL
pH: 5.5 (ref 5.0–8.0)

## 2010-10-24 LAB — DIFFERENTIAL
Lymphocytes Relative: 9 % — ABNORMAL LOW (ref 12–46)
Lymphs Abs: 1.2 10*3/uL (ref 0.7–4.0)
Monocytes Relative: 7 % (ref 3–12)
Neutro Abs: 10.5 10*3/uL — ABNORMAL HIGH (ref 1.7–7.7)
Neutrophils Relative %: 83 % — ABNORMAL HIGH (ref 43–77)

## 2010-10-24 LAB — POCT I-STAT, CHEM 8
BUN: 43 mg/dL — ABNORMAL HIGH (ref 6–23)
Creatinine, Ser: 1.6 mg/dL — ABNORMAL HIGH (ref 0.50–1.10)
Sodium: 132 mEq/L — ABNORMAL LOW (ref 135–145)
TCO2: 21 mmol/L (ref 0–100)

## 2010-10-25 ENCOUNTER — Other Ambulatory Visit: Payer: Self-pay | Admitting: Orthopedic Surgery

## 2010-10-25 ENCOUNTER — Telehealth: Payer: Self-pay | Admitting: *Deleted

## 2010-10-25 LAB — GLUCOSE, CAPILLARY
Glucose-Capillary: 49 mg/dL — ABNORMAL LOW (ref 70–99)
Glucose-Capillary: 142 mg/dL — ABNORMAL HIGH (ref 70–99)
Glucose-Capillary: 117 mg/dL — ABNORMAL HIGH (ref 70–99)
Glucose-Capillary: 144 mg/dL — ABNORMAL HIGH (ref 70–99)

## 2010-10-25 LAB — CARDIAC PANEL(CRET KIN+CKTOT+MB+TROPI)
CK, MB: 2.9 ng/mL (ref 0.3–4.0)
Relative Index: INVALID (ref 0.0–2.5)
Total CK: 45 U/L (ref 7–177)
Total CK: 49 U/L (ref 7–177)
Total CK: 44 U/L (ref 7–177)
Troponin I: <0.3 ng/mL (ref <0.30)

## 2010-10-25 NOTE — Telephone Encounter (Signed)
[facility] Triage Call Report Triage Record Num: [id number] Operator: Tarri Glenn Patient Name: Natasha West Call Date & Time: 10/24/2010 5:01:14PM Patient Phone: [phone] PCP: Neta Mends. Panosh Patient Gender: Female PCP Fax : [phone] Patient DOB: 31-Jul-1927 [facility] Name: [facility] Reason for Call: Juliette Alcide, Other, calling regarding Confusion. PCP is Panosh, Vickie Epley number is [phone]. Daughter/Melinda calling about patient is being treated for infection on right 2nd toe and the toe is not looking good, changing colors. Daughter also reports that confusion is increasing and patient needs alot of help now to get up and out of bed. Speech was also slurred. Afebrile. All emergent s/s r/o with exception to new or worsening confusion, disorientation, or agitation per Confusion, Disorientation, Agitation protocol. Advised daughter to take patient to ED for evaluation, daughter doesn't feel that her mom can sit through waiting in the ED for a bed, may and will probably wait and call office 10/25/10. Protocol(s) Used: Confusion, Disorientation, Agitation Recommended Outcome per Protocol: See ED Immediately Reason for Outcome: New or worsening confusion, disorientation, or agitation Care Advice: ~ Another adult should drive. ~ Do not leave patient alone. Write down provider's name. List or place the following in a bag for transport with the patient: current prescription and/or nonprescription medications; alternative treatments, therapies and medications; and street drugs. ~ Tell provider if this individual has recently started, stopped or changed dose of a prescribed, nonprescribed or alternative medication. ~ 10/24/2010 5:18:02PM Page 1 of 1 [id number]

## 2010-10-26 LAB — CBC
HCT: 35.1 % — ABNORMAL LOW (ref 36.0–46.0)
MCV: 88.2 fL (ref 78.0–100.0)
Platelets: 239 10*3/uL (ref 150–400)
RBC: 3.98 MIL/uL (ref 3.87–5.11)
RDW: 13.6 % (ref 11.5–15.5)
WBC: 8.1 10*3/uL (ref 4.0–10.5)

## 2010-10-26 LAB — BASIC METABOLIC PANEL
CO2: 25 mEq/L (ref 19–32)
Calcium: 9 mg/dL (ref 8.4–10.5)
Creatinine, Ser: 0.75 mg/dL (ref 0.50–1.10)
GFR calc Af Amer: >60 mL/min (ref >60)
Sodium: 136 mEq/L (ref 135–145)

## 2010-10-26 LAB — GLUCOSE, CAPILLARY
Glucose-Capillary: 118 mg/dL — ABNORMAL HIGH (ref 70–99)
Glucose-Capillary: 125 mg/dL — ABNORMAL HIGH (ref 70–99)

## 2010-10-27 LAB — CBC
HCT: 37.6 % (ref 36.0–46.0)
Hemoglobin: 12.7 g/dL (ref 12.0–15.0)
MCV: 87.4 fL (ref 78.0–100.0)
RBC: 4.3 MIL/uL (ref 3.87–5.11)
RDW: 13.6 % (ref 11.5–15.5)
WBC: 8.3 10*3/uL (ref 4.0–10.5)

## 2010-10-27 LAB — GLUCOSE, CAPILLARY
Glucose-Capillary: 106 mg/dL — ABNORMAL HIGH (ref 70–99)
Glucose-Capillary: 311 mg/dL — ABNORMAL HIGH (ref 70–99)
Glucose-Capillary: 251 mg/dL — ABNORMAL HIGH (ref 70–99)

## 2010-10-27 LAB — CARDIAC PANEL(CRET KIN+CKTOT+MB+TROPI)
CK, MB: 1.6 ng/mL (ref 0.3–4.0)
Relative Index: INVALID (ref 0.0–2.5)
Total CK: 30 U/L (ref 7–177)
Troponin I: <0.3 ng/mL (ref <0.30)

## 2010-10-27 LAB — COMPREHENSIVE METABOLIC PANEL
Alkaline Phosphatase: 62 U/L (ref 39–117)
BUN: 18 mg/dL (ref 6–23)
CO2: 23 mEq/L (ref 19–32)
GFR calc Af Amer: >60 mL/min (ref >60)
GFR calc non Af Amer: >60 mL/min (ref >60)
Glucose, Bld: 216 mg/dL — ABNORMAL HIGH (ref 70–99)
Potassium: 3.5 mEq/L (ref 3.5–5.1)
Total Protein: 6.2 g/dL (ref 6.0–8.3)

## 2010-10-27 LAB — TSH: TSH: 0.151 u[IU]/mL — ABNORMAL LOW (ref 0.350–4.500)

## 2010-10-27 LAB — MAGNESIUM: Magnesium: 1.8 mg/dL (ref 1.5–2.5)

## 2010-10-27 NOTE — Op Note (Signed)
[name], [name] NO.:  [id number]  MEDICAL RECORD NO.:  [id number]  LOCATION:  1232                         FACILITY:  [facility]  PHYSICIAN:  Harvie Junior, M.D.   DATE OF BIRTH:  11-24-1927  DATE OF PROCEDURE:  10/25/2010 DATE OF DISCHARGE:                              OPERATIVE REPORT   She is an 77 year old female.  PREOPERATIVE DIAGNOSIS:  Infected second toe with severe hammertoe deformity of fifth toe with previous infected sequelae of other toes.  POSTOPERATIVE DIAGNOSES: 1. Infected second toe with severe hammertoe deformity of fifth toe     with previous infected sequelae of other toes. 2. Prominent third metatarsal head. 3. Prominent fifth metatarsal head. 4. Prominent dorsal aspect of second metatarsal head.  PROCEDURES PERFORMED: 1. Amputation of metatarsophalangeal joint of the second toe, right     foot. 2. Amputation of metatarsophalangeal joint, fifth toe, right foot. 3. Dorsal wedge excision of second metatarsal head. 4. Excision of third metatarsal head. 5. Excision of fifth metatarsal head.  SURGEON:  Harvie Junior, MD  ASSISTANT:  Marshia Ly, PA  ANESTHESIA:  General.  BRIEF HISTORY:  Natasha West is an 76 year old female with long history of having had significant diabetic issues, had previous multiple surgical debridement of toes and because of this, she had a infected second toe. She was admitted to the medicine service and we were consulted. Examination at that time showed that she had a chronically-infected open wound on her second toe and we had a long talk with her about this.  We felt that amputating her second toe was appropriate.  We felt that amputating her fifth toe made sense because that was going to be the only toe that was left and we felt that rounding the metatarsal heads would be appropriate, so this is what we chose to be done.  She was brought to the operating room for these procedures.  DESCRIPTION OF  PROCEDURE:  Patient was brought to the operating room after adequate anesthesia was obtained with general anesthetic.  Patient was placed on the operating room table and the right foot was then prepped and draped in usual sterile fashion.  Following this, an elliptical incision was made over the second and third metatarsal heads and the metatarsophalangeal joint of the second toe was amputated. Attention was then turned towards an elliptical incision over the fifth toe and the metatarsophalangeal joint was resected.  At this point, the prominence of the metatarsal heads was identified and the second metatarsal head was resected to give a nice cascading release down the lateral aspect and once this was done, attention was turned towards peeling the third metatarsal head, which showed a nice cascade.  The fourth metatarsal head also in line and the fifth metatarsal head appeared to be prominent to this scenario, so at this point what we did was resect the fifth metatarsal head as well.  At this time, the wound was copiously and thoroughly lavaged.  Tourniquet was let down.  All bleeders were controlled with electrocautery and the wounds were closed with 2-0 nylon interrupted stitches.  Sterile compressive dressing was applied.  Patient taken to recovery room.  She  was noted to be in satisfactory condition.  Estimated blood loss for this procedure was none.     Harvie Junior, M.D.     Natasha West  D:  10/25/2010  T:  10/25/2010  Job:  [id number]  Electronically Signed by Jodi Geralds M.D. on 10/27/2010 09:10:01 PM

## 2010-10-27 NOTE — Consult Note (Signed)
NAMEAMAZING, COWMAN NO.:  [id number]  MEDICAL RECORD NO.:  [id number]  LOCATION:  1232                         FACILITY:  [facility]  PHYSICIAN:  Harvie Junior, M.D.   DATE OF BIRTH:  [birth year]  DATE OF CONSULTATION: DATE OF DISCHARGE:                                CONSULTATION   The patient is in [facility] and is in room #1232.  REFERRING PHYSICIAN:  Pleas Koch, MD  REASON FOR CONSULTATION:  Infection, right second toe.  BRIEF HISTORY:  Natasha West is an 77 year old female who has a history of hypertension, diabetes and chronic neuropathy of her lower extremities. She has a history of multiple amputations of her right foot, most recent, 2 done by Dr. Luiz Blare.  Initially she had fourth toe amputation in December 2009 and then subsequently had a first ray amputation and the tip of the second toe partially amputated in March 2010 by Dr. Luiz Blare and a third toe amputation for infection in January 2012 by Dr. Luiz Blare.  Four or five weeks ago, her daughter noticed that she was draining from her second toe and it was red.  She was started on oral antibiotics.  She ultimately saw Dr. Jerilee Field, vascular surgeon, who felt that she had good circulation based upon arterial Dopplers. Anyway, she presented the hospital with dizziness, weakness, dehydration and a draining second toe on her right foot.  X-rays of the right foot showed no evidence of osteomyelitis on plain film.  She had previous evidence of a second metatarsal fracture, had previous first ray amputation and evidence of amputation of the third and fourth toes.  She had remaining second and fifth toes noted.  There was no evidence of free air or gas in the soft tissues and there were no acute fractures noted.  We were consulted for evaluation of her draining right second toe related to her chronic diabetic neuropathy of her right foot.  PAST MEDICAL HISTORY:  Positive for neuropathy, diabetes,  hypertension, hyperlipidemia, and being status post amputation of right foot.  ALLERGIES:  PENICILLIN.  CURRENT MEDICATIONS:  Metformin, glimepiride, Amerigel, aspirin, hydrocodone, Lipitor, lisinopril, HCTZ and multivitamins.  FAMILY HISTORY:  Noncontributory.  SOCIAL HISTORY:  She does not smoke, drink alcohol or use drugs.  She lives with her husband.  PHYSICAL EXAMINATION:  VITAL SIGNS:  Her BP is 110/64, pulse 90, respirations 18, temperature she is afebrile. GENERAL EXAM:  She is alert and oriented and in no acute distress.  She is able to answer questions and has fluent speech. NECK:  Her neck is supple.  Negative JVD. CARDIAC EXAM:  Shows normal S1, S2 without murmurs, rubs or gallops. LUNGS:  Clear to auscultation. ABDOMEN:  Soft, positive bowel sounds. EXTREMITIES:  Her right foot exam shows evidence of previous amputation of her first ray and of her third and fourth toes.  Second toe is necrotic at the tip with some surrounding erythema and some drainage. She has remaining fifth toe noted as well and she has a bit of prominence of her third metatarsal head area and little bit of misshapen foot due to this.  She has no redness, streaking of her  foot.  Her DP pulse is 1+.  She has decreased sensation to soft touch.  IMPRESSION: 1. Infection right second toe, status post previous toe amputations     and first ray amputation, right foot. 2. Severe chronic diabetic neuropathy of the lower extremities.  PLAN:  Discussed with the patient and her daughter at length about treatment options.  I think she will not heal this toe.  She has been on oral  antibiotics for it.  I think she is a candidate for second toe amputation and as well a fifth toe amputation to relieve the fifth toe, it will basically end up giving her problems leading to further amputation.  We will probably do a partial resection of the third and fifth metatarsal heads to smooth her foot and decrease the  risk of further need for further surgery and/or rubbing in her foot wear.  I would discuss the risks and benefits of procedure including nonwound healing, further surgery in the future and etc. and the patient and her family are willing to accept these risks.  We will plan on bringing her to the operating room later today for this surgical procedure of her right foot.     Marshia Ly, P.A.   ______________________________ Harvie Junior, M.D.    JB/MEDQ  D:  10/25/2010  T:  10/25/2010  Job:  [id number]  cc:   Neta Mends. Fabian Sharp, MD [address] [place], [place] [address]  Quita Skye. Hart Rochester, M.D. [address] [place] [address]  Electronically Signed by Marshia Ly P.A. on 10/27/2010 05:15:01 PM Electronically Signed by Jodi Geralds M.D. on 10/27/2010 09:09:58 PM

## 2010-10-28 LAB — DIFFERENTIAL
Basophils Absolute: 0 10*3/uL (ref 0.0–0.1)
Basophils Relative: 0 % (ref 0–1)
Eosinophils Absolute: 0.2 10*3/uL (ref 0.0–0.7)
Eosinophils Relative: 3 % (ref 0–5)
Neutrophils Relative %: 59 % (ref 43–77)

## 2010-10-28 LAB — BASIC METABOLIC PANEL
BUN: 16 mg/dL (ref 6–23)
Calcium: 9.3 mg/dL (ref 8.4–10.5)
Chloride: 102 mEq/L (ref 96–112)
Creatinine, Ser: 0.79 mg/dL (ref 0.50–1.10)
GFR calc Af Amer: >60 mL/min (ref >60)

## 2010-10-28 LAB — CARDIAC PANEL(CRET KIN+CKTOT+MB+TROPI)
Relative Index: INVALID (ref 0.0–2.5)
Total CK: 30 U/L (ref 7–177)
Troponin I: <0.3 ng/mL (ref <0.30)

## 2010-10-28 LAB — GLUCOSE, CAPILLARY
Glucose-Capillary: 119 mg/dL — ABNORMAL HIGH (ref 70–99)
Glucose-Capillary: 137 mg/dL — ABNORMAL HIGH (ref 70–99)

## 2010-10-28 LAB — CBC
Platelets: 231 10*3/uL (ref 150–400)
RBC: 4.08 MIL/uL (ref 3.87–5.11)
RDW: 13.8 % (ref 11.5–15.5)
WBC: 7.6 10*3/uL (ref 4.0–10.5)

## 2010-10-28 LAB — HEMOGLOBIN A1C
Hgb A1c MFr Bld: 6.1 % — ABNORMAL HIGH (ref <5.7)
Mean Plasma Glucose: 128 mg/dL — ABNORMAL HIGH (ref <117)

## 2010-10-28 LAB — T3, FREE: T3, Free: 2.2 pg/mL — ABNORMAL LOW (ref 2.3–4.2)

## 2010-10-28 LAB — T4, FREE: Free T4: 1.34 ng/dL (ref 0.80–1.80)

## 2010-10-29 LAB — URINALYSIS, ROUTINE W REFLEX MICROSCOPIC
Bilirubin Urine: NEGATIVE
Hgb urine dipstick: NEGATIVE
Nitrite: NEGATIVE
Protein, ur: NEGATIVE mg/dL
Urobilinogen, UA: 0.2 mg/dL (ref 0.0–1.0)

## 2010-10-29 LAB — GLUCOSE, CAPILLARY
Glucose-Capillary: 147 mg/dL — ABNORMAL HIGH (ref 70–99)
Glucose-Capillary: 155 mg/dL — ABNORMAL HIGH (ref 70–99)
Glucose-Capillary: 161 mg/dL — ABNORMAL HIGH (ref 70–99)

## 2010-10-29 LAB — BASIC METABOLIC PANEL
CO2: 25 mEq/L (ref 19–32)
Calcium: 9.3 mg/dL (ref 8.4–10.5)
Chloride: 103 mEq/L (ref 96–112)
Creatinine, Ser: 0.84 mg/dL (ref 0.50–1.10)
GFR calc Af Amer: >60 mL/min (ref >60)
Sodium: 136 mEq/L (ref 135–145)

## 2010-10-30 LAB — GLUCOSE, CAPILLARY
Glucose-Capillary: 122 mg/dL — ABNORMAL HIGH (ref 70–99)
Glucose-Capillary: 145 mg/dL — ABNORMAL HIGH (ref 70–99)
Glucose-Capillary: 166 mg/dL — ABNORMAL HIGH (ref 70–99)

## 2010-10-30 LAB — BASIC METABOLIC PANEL
CO2: 25 mEq/L (ref 19–32)
Chloride: 104 mEq/L (ref 96–112)
Creatinine, Ser: 0.9 mg/dL (ref 0.50–1.10)
GFR calc Af Amer: >60 mL/min (ref >60)
Potassium: 4.3 mEq/L (ref 3.5–5.1)

## 2010-10-30 LAB — CULTURE, BLOOD (ROUTINE X 2)
Culture  Setup Time: 201207152359
Culture: NO GROWTH

## 2010-10-31 LAB — URINE CULTURE: Culture  Setup Time: 201207210243

## 2010-11-05 ENCOUNTER — Encounter: Payer: Medicare Other | Admitting: Vascular Surgery

## 2010-11-08 NOTE — H&P (Signed)
Natasha West, Natasha West NO.:  [id number]  MEDICAL RECORD NO.:  [id number]  LOCATION:  WLED                         FACILITY:  [facility]  PHYSICIAN:  Houston Siren, MD           DATE OF BIRTH:  June 16, 1927  DATE OF ADMISSION:  10/24/2010 DATE OF DISCHARGE:                             HISTORY & PHYSICAL   PRIMARY CARE PHYSICIAN:  Neta Mends. Panosh, MD  ADVANCE DIRECTIVE:  Full code.  This was reconfirmed tonight.  REASON FOR ADMISSION:  Dizziness, weakness, dehydration,  and possible sepsis.  HISTORY OF PRESENT ILLNESS:  This is an 77 year old female with history of hypertension on an ACE inhibitor and diuretic, diabetes, with neuropathy status post several toe amputations recently, given Levaquin for her right second toe infection, presents with a complaint of generalized weakness, lightheadedness, and a fall.  She also has some nausea but no vomiting.  She has had no fever or chills.  Denies any chest pain or shortness of breath.  Evaluation in the emergency room show initial hypotension with a systolic blood pressure of 89, responded to IV fluids with current systolic blood  pressure of 110.  She is currently afebrile.  She did have leukocytosis with a white count of 12.7 thousand.  Her urinalysis is negative.  She did have elevated lactic acid to 5, but her procalcitonin is less than 0.1.  She does have elevation of BUN of 43 and creatinine 1.6.  An x-ray of her foot showed no osseous changes and no subcutaneous gas.  Hospitalist was asked to admit the patient for further evaluation and treatment.  PAST MEDICAL HISTORY: 1. Neuropathy. 2. Diabetes. 3. Hypertension. 4. Hyperlipidemia. 5. Status post amputation.  ALLERGY:  PENICILLIN.  CURRENT MEDICATIONS: 1. Metformin. 2. Glimepiride. 3. Amerigel. 4. Aspirin. 5. Hydrocodone. 6. Lipitor 10 mg per day. 7. Lisinopril. 8. HCTZ. 9. Multivitamin.  FAMILY HISTORY:  Review of family history is  noncontributory.  SOCIAL HISTORY:  She denied tobacco, alcohol, or drug use.  She lives with her husband.  PHYSICAL EXAM:  VITAL SIGNS:  Current blood pressure 110/64, pulse of 90, respiratory rate 18, temperature afebrile. GENERAL:  She is alert and oriented and conversing.  She has no facial symmetry, and she has fluent speech. NECK:  Supple.  Throat is clear. CARDIAC:  Exam revealed an S1, S2 regular.  I did not hear any murmur, rub, or gallop. LUNGS:  Clear. ABDOMEN:  Exam is soft, nondistended, nontender. EXTREMITIES:  Show no edema.  She has several toe amputation on her right foot, her second right toe is necrotic and inflamed with some surrounding erythema.  Psychiatric exam and neurological exams unremarkable as well.  OBJECTIVE FINDINGS:  As noted above.  Urinalysis is negative.  White count of 12.7 thousand, hemoglobin of 14.3, platelet count of 281,000. Lactic acid is 5.0.  Pro calcitonin less than 0.1 right foot film shows no bone destruction and no subcutaneous gas. Creatinine 1.60, BUN of 43, glucose of 86, potassium of 4.5. Serum sodium 132.  IMPRESSION:  This is an 76 year old diabetic female with toe infection, presented with hypotension.  Concern was whether or not this represent sepsis.  Critical Care, Dr. Ortencia Kick was called by the emergency room physician and deferred admission to hospitalist.  I think the patient is actually volume depleted from decrease p.o. intake and diuretic rather than true sepsis.  We will admit her to step-down for closer monitoring.  She was given Elita Quick in the emergency room, and I will add clindamycin to cover the anaerobics and more gram-positive infection.  She had blood cultures done as  well.  Will give her intravenous fluids and make her n.p.o. pending evaluation by ortho.  Please consult ortho tomorrow.  I will hold her ACE inhibitor and her diuretics.  She will be placed on insulin sliding scale.  She also has elevated creatinine,  and I will stop her metformin.  She is a full code and will be admitted to [facility].     Houston Siren, MD     PL/MEDQ  D:  10/25/2010  T:  10/25/2010  Job:  [id number]  cc:   Neta Mends. Fabian Sharp, MD [address] [place], [place] [address]  Electronically Signed by Houston Siren  on 11/08/2010 03:18:04 AM

## 2010-11-10 NOTE — Discharge Summary (Signed)
[name], West NO.:  [id number]  MEDICAL RECORD NO.:  [id number]  LOCATION:  1426                         FACILITY:  [facility]  PHYSICIAN:  Ramiro Harvest, MD    DATE OF BIRTH:  November 03, 1927  DATE OF ADMISSION:  10/24/2010 DATE OF DISCHARGE:  10/30/2010                              DISCHARGE SUMMARY   PRIMARY CARE PHYSICIAN:  Berniece Andreas, MD  ORTHOPEDICS: 1. Harvie Junior, MD 2. Marshia Ly, [place]  ANTICIPATED DATE OF DISCHARGE:  October 30, 2010.  DISCHARGE DIAGNOSES: 1. Infected second toe with severe hammertoe deformity of the fifth     toe, with previous infected sequelae of other toes.  Status post     amputation of the second and fifth toe October 25, 2010, per Dr. Jodi Geralds. 2. Sepsis secondary to #1. 3. Hypotension, resolved. 4. Sinus tachycardia, resolved. 5. Urinary retention. 6. Type 2 diabetes, hemoglobin A1c 6.1. 7. Hypertension. 8. Hyperlipidemia. 9. Degenerative joint disease. 10.Status post left total knee arthroscopy. 11.History of neuropathy. 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 Luiz Blare. 13.Anemia of chronic disease. 14.History of gangrenous fourth toe, status post right foot fourth toe     amputation April 02, 2008, per Dr. Charlsie Merles. 15.Prior history of left elbow fracture, status post ORIF December 02, 2003, per Dr. Amanda Pea. 16.Euthyroid sick syndrome.  DISCHARGE MEDICATIONS: 1. Tylenol 650 mg p.o. q.4 h p.r.n. 2. Bactrim DS 1 tablet p.o. b.i.d. x4 more days. 3. Aspirin 81 mg p.o. daily p.r.n. 4. Amaryl 4 mg p.o. b.i.d. 5. Lipitor 10 mg p.o. q.a.m. 6. Metformin 500 mg p.o. b.i.d. 7. Multivitamin 1 tablet p.o. daily. 8. Vicodin 5/500 half to 1 tablet p.o. q.i.d. p.r.n. pain.  DISPOSITION AND FOLLOWUP: 1. The patient will be discharged to a skilled nursing facility. 2. The patient will need to follow up with Orthopedics, Dr. Jodi Geralds, or Marshia Ly, [place], on Wednesday November 03, 2010.  The     patient will need to call the office number at [phone] for     specific appointment time.  The patient is to be weightbearing as     tolerated on the heel and elevate right foot on two pillows when     sitting or nonambulatory.  No dressing changes until seen by     Orthopedics. 3. The patient is also to follow up with PCP 1 to 2 weeks post     discharge or per MD at the skilled nursing facility.  A BMET will     need to be checked and also the patient's blood pressure will need     to be reassessed as the blood pressure medications were     discontinued during this hospitalization and her blood pressure has     remained stable.  We defer to them when to resume blood pressure     medications. 4. The patient will need thyroid function studies done in about 4 to 6     weeks to follow up on her TFTs. 5. The patient will be  discharged with a Foley catheter secondary to     urinary retention.  The patient will need to follow up with     Urology.  The patient will need to call to schedule an appointment     with [facility] 1 week post discharge for a voiding trial and     reassessment of urinary retention, the number there is [phone].  CONSULTATIONS:  An orthopedic consultation was done.  The patient was seen in consultation by Dr. Jodi Geralds on October 25, 2010.  PROCEDURES PERFORMED: 1. X-ray of the right foot was done October 24, 2010, that showed     satisfactory appearance of the residual right foot following     interval multifocal amputations.  No acute osseous findings or     subcutaneous gas. 2. The patient had amputation of metatarsophalangeal joint of the     second toe of the right foot, amputation of metatarsophalangeal     joint fifth toe right foot, and also wedge excision of the second     metatarsal head, excision of the third metatarsal head, and     excision of the fifth metatarsal head per Dr. Luiz Blare on  10/25/2010.  BRIEF ADMISSION HISTORY AND PHYSICAL:  [name] is an 77 year old Caucasian female with history of hypertension, on ACE inhibitor and diuretic; history of diabetes with neuropathy, status post several toes amputations; recently placed on Levaquin for her right second toe infection, presented with a complaint of generalized weakness, lightheadedness and a fall.  The patient also had some nausea but no vomiting.  The patient had no fever or chills.  Denied any chest pain or shortness of breath.  Evaluation in the ED showed initial hypotension with a systolic blood pressure of 89 which responded to IV fluids bringing the pressure up to 110.  The patient is afebrile.  She did have a leukocytosis with a white count of 12.7.  Urinalysis was negative. She did have an elevated lactic acid level of 5, pro calcitonin was less than 0.1.  The patient did have an elevation of BUN of 43 with a creatinine of 1.6.  X-ray of the foot showed no osseous changes.  No subcutaneous gas.  Hospitalists were called to admit the patient for further evaluation and treatment.  For the rest of admission history and physical, please see H and P dictated by Dr. Nedra Hai of job number [id number].  HOSPITAL COURSE: 1. Sepsis:  The patient was initially admitted with a probable sepsis     felt to be secondary to an infected second toe.  The patient was     placed on IV fluids, placed empirically on IV Fortaz and     clindamycin.  Blood cultures were ordered but with no growth to     date.  The patient's blood pressure responded to IV fluids.  He was     maintained on IV antibiotics.  Orthopedic consultation was done.     The patient was seen in consultation by Dr. Jodi Geralds on October 25, 2010.  At the time it was felt that the patient was a candidate for     amputation of the second and fifth toes as well.  The patient was     subsequently taken to the operating room where she had amputation,     as  stated above, of the MTP joint of the second toe of the right     foot as well as  amputation of the MTP joint of the fifth toe of the     right foot done on October 25, 2010, per Dr. Luiz Blare.  The patient     tolerated the procedure well without any complications, was     subsequently transitioned to the floor from the step-down unit.     The patient remained in stable and improved condition.  She has     been subsequently transitioned to oral antibiotics.  She will     complete four more days of oral Bactrim to complete a 1-week course     of antibiotic therapy.  The patient will follow up with Orthopedics     as stated above. 2. Infected second toe with severe hammertoe deformity of the fifth     toe with previous infected sequelae of other toes, status post     amputation of MTP joint of the right second toe and the right fifth     toe.  On admission, the patient was noted to have an infected     second toe which was felt to be causing her sepsis.  X-rays which     were done were negative for osteomyelitis or subcutaneous gas.  The     patient was placed empirically on IV Fortaz and clindamycin.     Orthopedic consultation was done.  The patient was seen in     consultation by Dr. Jodi Geralds on October 25, 2010.  It was     recommended that the patient would benefit from amputation.  The     patient subsequently underwent amputation as stated above on October 26, 2010, where she had amputation of the MTP joint of the second     toe and fifth toe on the right foot.  She also had a dorsal wedge     excision of the second metatarsal head as well as excision of the     third metatarsal and fifth metatarsal head.  The patient tolerated     the procedure well.  She was maintained on IV antibiotics.  She was     subsequently transitioned to oral Bactrim.  The patient will need     four more days of oral Bactrim to complete a 1-week course of     antibiotic therapy.  The patient has had dressing  changes done per     Ortho.  It was recommended that the patient be weightbearing as     tolerated on the right heel with no dressing changes.  The patient     should keep leg elevated on two pillows when she is sitting or     nonambulatory and the patient is to follow up with Orthopedics, as     stated above, on November 03, 2010.  The patient will need to call the     number there, [phone], for a specific appointment.  The patient     will be seen by Marshia Ly, PA, or Dr. Jodi Geralds.  The patient     is currently in stable and improved condition and will be     discharged to a skilled nursing facility. 3. Acute renal failure:  On admission, the patient was noted to be in     acute renal failure with a creatinine of 1.6.  It was felt this was     secondary to a prerenal azotemia.  The patient was hydrated with IV     fluids with resolution of  her acute renal failure. 4. Sinus tachycardia:  During the hospitalization, the patient was     noted to have a sinus tachycardia with heart rate in the 130s.  The     patient remained afebrile.  The patient did not have any further     signs of infection apart from toe infection.  Cardiac enzymes which     were cycled were negative x3.  TSH which was obtained did have a     TSH of 0.151 and free T4 was within normal limits at 1.34, T3 was     at 2.2.  It was felt this was likely secondary to euthyroid sick     syndrome causing the thyroid abnormalities.  The patient remained     in a stable condition.  She was given a one-time dose of Lopressor     5 mg IV x1 with resolution of her sinus tachycardia.  The patient's     sinus tachycardia has resolved over the past 48 hours.  She has had     no further episodes and the patient will be discharged in a stable     condition. 5. Urinary retention:  On admission, a Foley catheter was initially     placed.  Once the Foley catheter was removed, the patient was noted     to have urinary retention.   Bladder scan which was done did show     500 mL of urine in the bladder.  A Foley catheter has been     replaced; the patient will be discharged on a Foley catheter.  She     will need to call [facility] for followup appointment 1 week     post discharge for a voiding trial and further evaluation of her     urinary retention. 6. Euthyroid sick syndrome:  During the hospitalization, the patient     did have sinus tachycardia.  With the workup, a TSH was obtained.     TSH came back at 0.151, free T4 which was obtained was within     normal limits at 1.34, T3 which was obtained was slightly below the     lower limits of normal at 2.2.  The patient remained     asymptomatic.  It was felt this was likely secondary to euthyroid     sick syndrome.  The patient will need followup thyroid function     studies done in about 4 to 6 weeks.  It has been a pleasure taking care of [name].     Ramiro Harvest, MD     DT/MEDQ  D:  10/29/2010  T:  10/29/2010  Job:  [id number]  cc:   Neta Mends. Fabian Sharp, MD [address] [place], [place] [address]  Harvie Junior, M.D. Fax: [phone]  Marshia Ly, P.A.  [facility] [address] [place], [place] [address]  Electronically Signed by Ramiro Harvest MD on 11/10/2010 08:08:39 PM

## 2010-11-11 ENCOUNTER — Ambulatory Visit: Payer: Medicare Other | Admitting: Internal Medicine

## 2010-11-17 ENCOUNTER — Other Ambulatory Visit: Payer: Self-pay | Admitting: Neurology

## 2010-11-17 DIAGNOSIS — G63 Polyneuropathy in diseases classified elsewhere: Secondary | ICD-10-CM

## 2010-11-17 DIAGNOSIS — R269 Unspecified abnormalities of gait and mobility: Secondary | ICD-10-CM

## 2010-11-17 DIAGNOSIS — F079 Unspecified personality and behavioral disorder due to known physiological condition: Secondary | ICD-10-CM

## 2010-12-10 ENCOUNTER — Ambulatory Visit (INDEPENDENT_AMBULATORY_CARE_PROVIDER_SITE_OTHER): Payer: Medicare Other | Admitting: Internal Medicine

## 2010-12-10 ENCOUNTER — Encounter: Payer: Self-pay | Admitting: Internal Medicine

## 2010-12-10 VITALS — BP 120/80 | HR 88 | Temp 98.0°F

## 2010-12-10 DIAGNOSIS — I10 Essential (primary) hypertension: Secondary | ICD-10-CM

## 2010-12-10 DIAGNOSIS — G609 Hereditary and idiopathic neuropathy, unspecified: Secondary | ICD-10-CM

## 2010-12-10 DIAGNOSIS — E785 Hyperlipidemia, unspecified: Secondary | ICD-10-CM

## 2010-12-10 DIAGNOSIS — R413 Other amnesia: Secondary | ICD-10-CM

## 2010-12-10 DIAGNOSIS — E1149 Type 2 diabetes mellitus with other diabetic neurological complication: Secondary | ICD-10-CM

## 2010-12-10 LAB — HEMOGLOBIN A1C: Hgb A1c MFr Bld: 5.6 % (ref 4.6–6.5)

## 2010-12-10 LAB — VITAMIN B12: Vitamin B-12: 357 pg/mL (ref 211–911)

## 2010-12-10 LAB — TSH: TSH: 0.38 u[IU]/mL (ref 0.35–5.50)

## 2010-12-10 LAB — BASIC METABOLIC PANEL
BUN: 14 mg/dL (ref 6–23)
Calcium: 9.3 mg/dL (ref 8.4–10.5)
Creatinine, Ser: 0.9 mg/dL (ref 0.4–1.2)
GFR: 66.36 mL/min (ref >60.00)
Glucose, Bld: 64 mg/dL — ABNORMAL LOW (ref 70–99)
Potassium: 4.2 mEq/L (ref 3.5–5.1)

## 2010-12-10 LAB — T4, FREE: Free T4: 1.01 ng/dL (ref 0.60–1.60)

## 2010-12-10 LAB — T3, FREE: T3, Free: 2.8 pg/mL (ref 2.3–4.2)

## 2010-12-10 NOTE — Progress Notes (Signed)
Subjective:    Patient ID: Natasha West, female    DOB: [birth year], 77 y.o.   MRN: [id number]  HPI History taken from family discharge summary from hospital. Med list from post discharge sheet from [facility]. Comes with daughter and husband today for  Follow up visit after  Partial amputation right foot per Dr. Luiz Blare  When she presented with sepsis and infection and hypotension.  Dr Jarrett Soho her for memory issues  That has deteriorated since hospitalization quite severely.   In the meantime   eval pending.   home health PT OT  Was planned but PT not come yet.  Mobility regressed There is an aid coming for Help with dressing and bathing.    Is numb from almost the knees down Last check Dr Luiz Blare last week and  Area of abrasion getting better  There is no one checking her feet when nurse stopped coming out except the podiatrist will be doing this once a month.  She did have a urinary tract infection in the hospital and had some difficulty with urinary retention with the Foley and apparently he was managed by urologist.  While in the hospital her thyroid tests were slightly off that were felt to be possibly from sick syndrome and was to be repeated. It is unclear if this has been done.  Her creatinine peaked at 1.6 and came down with hydration DM: By history she apparently had a blood sugar of 31 morning at [facility]. Her Amaryl was cut back to 4 mg a day. There is no known further repeat of this on repetitive questioning. She is eating better since she came home and her fasting blood sugars are in the 120 to 1:30 range according to her husband Review of Systems No fever syncope  Not walking much since PT didn't come out.  No change in vision no cough/ no bleeding vomiting.  No joint swelling. No fever no UTI symptoms. No hypoglycemia since she has been at home.   Past history family history social history reviewed in the electronic medical record.      Objective:   Physical  Exam WD in WC in nad  . Alert ; oriented to person  Cannot remember   DOB and year .  AT heent  Op moist mucous membrane  Neck no masses  No bruits  Chest:  Clear to A&P without wheezes rales or rhonchi CV:  S1-S2 no gallops or murmurs peripheral perfusion is normal NO bruits heard. Ext :  Right foot  Amputated with    Wound cover   Area medial  No edema  Skin  Left lateral calf with flat / bruiselike area right calf a large keratosis There is a order pending for head ct scan    Dr Anne Hahn scanned note    And rec from early august noted.       Assessment & Plan:  S/p amputation part of  Right foot   From infection   Dm with peripheral neuropathy   discussed at length to avoid hypoglycemia at all cost. I have a very low threshold to stop the Amaryl altogether but she will be eating normally again and her current sugars are not low. Memory sig decline   Since I have seen her last.   We'll get laboratory studies that Dr. Anne Hahn have recommended and they will contact his office about the CT scan. Daughter asked about current status of knowledge about medications for dementia. LIPiDS:  No change in  meds do not think contributing by  Hx  Use for stroke  and  Event prevention.   Can disc with sis if wants to continue.   (After patient left noted that they were giving her Pravachol in rehabilitation but her med list says atorvastatin.  We'll  Need to contact her family about ?changing medicines to Pravachol. Vs atorvastatin. )  HT had low bp in hospital ok to stay off  Med for now Hx of borderline low b12 level good on oral  Will recheck levels  Major change in health status and independence . Husband is diabetic and informed but has low vision .  Daughters delve out meds.  May need different  Living arrangements  And help at  Home.     Prolonged visit and obtaining and review of record 50 minutes .

## 2010-12-10 NOTE — Patient Instructions (Signed)
Will notify you  of labs when available. And will get result to Dr Anne Hahn. Call Dr Anne Hahn office about the CT scan. Check blood sugar daily . We may need to  Stop or decrease the amaryl    If getting any low blood sugars .  Metformin is ok if  Kidney function is ok.  Have someone check  Feet and skin at least every other day for sores and abrasions as she cannot detect this problem.

## 2010-12-10 NOTE — Assessment & Plan Note (Signed)
Significant deterioration after the past hospitalization for sepsis and infection. Seen by Dr. Anne Hahn neurology CT scan is planned we'll do labs as recommended although they have been okay in the past

## 2010-12-14 ENCOUNTER — Telehealth: Payer: Self-pay | Admitting: *Deleted

## 2010-12-14 NOTE — Telephone Encounter (Signed)
Message copied by Romualdo Bolk on Tue Dec 14, 2010  8:11 AM ------      Message from: [facility], [place] K      Created: Fri Dec 10, 2010  5:34 PM       Call daughter or patient's residence ;it appears that she was on Pravachol  40 at [facility] but her med list here atorvastatin 10 mg.  If they want  her to remain on cholesterol medication they need to be aware this is a change from what she was getting [place] and also. It is less expensive to take the Pravachol we will switch her over. Please advise.            Wp

## 2010-12-14 NOTE — Telephone Encounter (Signed)
Spoke with pt's husband and pt was taking Lipitor and never pravachol. Pt is going to remain on this medication

## 2010-12-16 ENCOUNTER — Telehealth: Payer: Self-pay | Admitting: *Deleted

## 2010-12-16 NOTE — Telephone Encounter (Signed)
Left message to call back  

## 2010-12-16 NOTE — Telephone Encounter (Signed)
Message copied by Romualdo Bolk on Thu Dec 16, 2010 12:55 PM ------      Message from: [facility], [place] K      Created: Thu Dec 16, 2010  6:00 AM       Tell( patient) family.Marland Kitchendaughter and husband     that her labs are normal except her blood sugar  Reported as low normal. I think we should decrease the amaryl to  2 mg per day and continue to check. If her blood sugars are under 90 then stop med all together.                        Her b 12 level is on the lower end of normal 357 take 1000 mg of vit b12  Per day if not already at that dose .      Send copy of these to Dr Anne Hahn her neurologist      (Please document any changes on the med list as appropriate.)

## 2010-12-20 NOTE — Telephone Encounter (Signed)
Left message to call back  

## 2010-12-20 NOTE — Telephone Encounter (Signed)
Pt's daughter aware of results °

## 2010-12-23 ENCOUNTER — Telehealth: Payer: Self-pay | Admitting: *Deleted

## 2010-12-23 NOTE — Telephone Encounter (Signed)
Spoke to daughter- they did see Neuro Dr. Anne Hahn about this. She is also spoke with Child psychotherapist as well. They are trying to put her somewhere and pt's husband is starting to realize that she needs increase care level. Dr. Anne Hahn would like to her to have a CT scan done but they can't be done due to financial issues. They are willing to get this done at some point. Her daughter is also looking into an adult daycare that has medical staff and social workers there. They told the daughter that pt may even qualify for medicaid now. So they will keep [place] informed about everything and know they can contact us if they need any help.

## 2010-12-29 ENCOUNTER — Telehealth: Payer: Self-pay | Admitting: *Deleted

## 2010-12-29 NOTE — Telephone Encounter (Signed)
Received a fax saying that they recommend Aricept for pt's signigicant memory problems. See fax.

## 2010-12-29 NOTE — Telephone Encounter (Signed)
Per Dr. Annia Belt Pt and ask if they have seen Dr. Anne Hahn and if it's okay to start aricept. Pt and daughter have to agree about this.

## 2010-12-30 MED ORDER — DONEPEZIL HCL 5 MG PO TABS: 5.0000 mg | ORAL_TABLET | Freq: Every day | ORAL | Status: DC

## 2010-12-30 NOTE — Telephone Encounter (Signed)
Spoke to Daughter and she is okay with this. She states that pt is not with it any more. They would like to go ahead with aricept. Per Dr. Fabian Sharp- okay to call in rx but pt needs a rov in 1 month.  Rx sent to pharmacy. Daughter aware of this and will call back to schedule an appt.

## 2010-12-31 ENCOUNTER — Other Ambulatory Visit: Payer: Self-pay | Admitting: Internal Medicine

## 2011-01-14 LAB — COMPREHENSIVE METABOLIC PANEL
AST: 26 U/L (ref 0–37)
Albumin: 3.3 g/dL — ABNORMAL LOW (ref 3.5–5.2)
Alkaline Phosphatase: 54 U/L (ref 39–117)
BUN: 14 mg/dL (ref 6–23)
Chloride: 104 mEq/L (ref 96–112)
GFR calc Af Amer: >60 mL/min (ref >60)
Potassium: 4.1 mEq/L (ref 3.5–5.1)
Total Bilirubin: 0.6 mg/dL (ref 0.3–1.2)
Total Protein: 7.4 g/dL (ref 6.0–8.3)

## 2011-01-14 LAB — URINALYSIS, ROUTINE W REFLEX MICROSCOPIC
Bilirubin Urine: NEGATIVE
Ketones, ur: NEGATIVE mg/dL
Specific Gravity, Urine: 1.007 (ref 1.005–1.030)
pH: 7 (ref 5.0–8.0)

## 2011-01-14 LAB — GLUCOSE, CAPILLARY
Glucose-Capillary: 107 mg/dL — ABNORMAL HIGH (ref 70–99)
Glucose-Capillary: 115 mg/dL — ABNORMAL HIGH (ref 70–99)
Glucose-Capillary: 118 mg/dL — ABNORMAL HIGH (ref 70–99)
Glucose-Capillary: 148 mg/dL — ABNORMAL HIGH (ref 70–99)
Glucose-Capillary: 161 mg/dL — ABNORMAL HIGH (ref 70–99)
Glucose-Capillary: 163 mg/dL — ABNORMAL HIGH (ref 70–99)
Glucose-Capillary: 177 mg/dL — ABNORMAL HIGH (ref 70–99)
Glucose-Capillary: 185 mg/dL — ABNORMAL HIGH (ref 70–99)
Glucose-Capillary: 190 mg/dL — ABNORMAL HIGH (ref 70–99)

## 2011-01-14 LAB — DIFFERENTIAL
Basophils Relative: 1 % (ref 0–1)
Eosinophils Relative: 1 % (ref 0–5)
Lymphocytes Relative: 21 % (ref 12–46)
Monocytes Absolute: 0.5 10*3/uL (ref 0.1–1.0)
Monocytes Relative: 5 % (ref 3–12)
Neutro Abs: 6.5 10*3/uL (ref 1.7–7.7)

## 2011-01-14 LAB — CBC
HCT: 32.7 % — ABNORMAL LOW (ref 36.0–46.0)
HCT: 35.5 % — ABNORMAL LOW (ref 36.0–46.0)
MCHC: 33.8 g/dL (ref 30.0–36.0)
MCV: 93.5 fL (ref 78.0–100.0)
Platelets: 270 10*3/uL (ref 150–400)
Platelets: 307 10*3/uL (ref 150–400)
RDW: 13.4 % (ref 11.5–15.5)
RDW: 13.5 % (ref 11.5–15.5)
WBC: 6.8 10*3/uL (ref 4.0–10.5)
WBC: 9 10*3/uL (ref 4.0–10.5)

## 2011-01-14 LAB — URINE MICROSCOPIC-ADD ON

## 2011-01-14 LAB — TISSUE CULTURE: Gram Stain: NONE SEEN

## 2011-01-14 LAB — HIGH SENSITIVITY CRP: CRP, High Sensitivity: 23 mg/L — ABNORMAL HIGH

## 2011-01-14 LAB — SEDIMENTATION RATE: Sed Rate: 37 mm/hr — ABNORMAL HIGH (ref 0–22)

## 2011-01-14 LAB — HEMOGLOBIN A1C: Hgb A1c MFr Bld: 6.8 % — ABNORMAL HIGH (ref 4.6–6.1)

## 2011-01-14 LAB — ANAEROBIC CULTURE: Gram Stain: NONE SEEN

## 2011-01-14 LAB — HUMAN PARVOVIRUS DNA DETECTION BY PCR: Parvovirus B19, PCR: NOT DETECTED

## 2011-01-18 ENCOUNTER — Telehealth: Payer: Self-pay | Admitting: *Deleted

## 2011-01-18 NOTE — Telephone Encounter (Signed)
Per Dr. Fabian Sharp- call Lawson Fiscal and advise to stop amaryl concerned about risk of low blood sugar. Make sure they are checking her bs daily and call with readings. Lori aware. Lawson Fiscal states that pt's husband is taking care of this but will call with her readings. She is also wanting to know if they can increase the aricept. It is working some and pt is not having any side effects of this. Since this is a low dose they are wondering if increasing the dose would make more of a difference.

## 2011-01-18 NOTE — Telephone Encounter (Signed)
Per Dr. Fabian Sharp- can increase to 10mg .

## 2011-01-19 LAB — BASIC METABOLIC PANEL
CO2: 24
Chloride: 106
GFR calc Af Amer: >60
Potassium: 3.3 — ABNORMAL LOW
Sodium: 137

## 2011-01-19 LAB — CBC
HCT: 34.8 — ABNORMAL LOW
Hemoglobin: 12
MCHC: 34.6
MCV: 92.7
RBC: 3.75 — ABNORMAL LOW
WBC: 16.6 — ABNORMAL HIGH

## 2011-01-19 MED ORDER — DONEPEZIL HCL 10 MG PO TABS: 10.0000 mg | ORAL_TABLET | Freq: Every day | ORAL | Status: DC

## 2011-01-19 NOTE — Telephone Encounter (Signed)
Daughter aware and rx called in.

## 2011-01-20 LAB — URINALYSIS, ROUTINE W REFLEX MICROSCOPIC
Nitrite: NEGATIVE
Specific Gravity, Urine: 1.021
Urobilinogen, UA: 0.2
pH: 7.5

## 2011-01-20 LAB — CBC
HCT: 38.6
MCHC: 34.7
MCV: 91.8
Platelets: 221

## 2011-01-20 LAB — DIFFERENTIAL
Basophils Absolute: 0.7 — ABNORMAL HIGH
Eosinophils Relative: 0
Lymphocytes Relative: 4 — ABNORMAL LOW
Lymphs Abs: 0.9
Monocytes Relative: 3

## 2011-01-20 LAB — CULTURE, BLOOD (ROUTINE X 2)

## 2011-01-20 LAB — COMPREHENSIVE METABOLIC PANEL
AST: 28
Albumin: 3.8
BUN: 15
CO2: 25
Calcium: 9.6
Chloride: 95 — ABNORMAL LOW
Creatinine, Ser: 1.01
GFR calc Af Amer: >60
GFR calc non Af Amer: 53 — ABNORMAL LOW
Total Bilirubin: 1.3 — ABNORMAL HIGH

## 2011-01-20 LAB — HEMOGLOBIN A1C
Hgb A1c MFr Bld: 6.9 — ABNORMAL HIGH
Mean Plasma Glucose: 168

## 2011-02-07 ENCOUNTER — Other Ambulatory Visit: Payer: Self-pay | Admitting: Internal Medicine

## 2011-03-30 ENCOUNTER — Other Ambulatory Visit: Payer: Self-pay | Admitting: Internal Medicine

## 2011-04-01 ENCOUNTER — Telehealth: Payer: Self-pay | Admitting: Family Medicine

## 2011-04-01 ENCOUNTER — Encounter: Payer: Self-pay | Admitting: Family Medicine

## 2011-04-01 ENCOUNTER — Ambulatory Visit (INDEPENDENT_AMBULATORY_CARE_PROVIDER_SITE_OTHER): Payer: Medicare Other | Admitting: Family Medicine

## 2011-04-01 VITALS — BP 124/78 | HR 93 | Temp 97.5°F | Wt 138.0 lb

## 2011-04-01 DIAGNOSIS — L8990 Pressure ulcer of unspecified site, unspecified stage: Secondary | ICD-10-CM

## 2011-04-01 DIAGNOSIS — L8991 Pressure ulcer of unspecified site, stage 1: Secondary | ICD-10-CM

## 2011-04-01 DIAGNOSIS — E119 Type 2 diabetes mellitus without complications: Secondary | ICD-10-CM

## 2011-04-01 MED ORDER — CEPHALEXIN 500 MG PO CAPS: 500.0000 mg | ORAL_CAPSULE | Freq: Four times a day (QID) | ORAL | Status: AC

## 2011-04-01 MED ORDER — MUPIROCIN CALCIUM 2 % EX CREA: TOPICAL_CREAM | Freq: Three times a day (TID) | CUTANEOUS | Status: AC

## 2011-04-01 NOTE — Progress Notes (Signed)
  Subjective:    Patient ID: Natasha West, female    DOB: 08-20-1927, 77 y.o.   MRN: [id number]  HPI Here with her family for several days of a small ulcer on the tip of her right foot. She is S/P forefoot amputation on this foot, and she had been wheelchair bound for a long time. Then a month or so ago she had started to walk a little more with her walker. No doubt this has caused the ulcer. There is no pain, but she has reduced sensation in the foot anyway. No drainage.    Review of Systems  Constitutional: Negative.        Objective:   Physical Exam  Constitutional: She appears well-developed and well-nourished.  Musculoskeletal:       The right forefoot has a small superficial pressure sore over the tip of one of the lateral metatarsals. No drainage or erythema           Assessment & Plan:  Early pressure wound. Cover aggressively for infection with Bactroban and Keflex. Dress daily with gauze. She is going to see Company secretary later today to check the fit of her orthopedic shoes.

## 2011-04-01 NOTE — Telephone Encounter (Signed)
Pharm did not have cream  doc prescribed, however pharm does have ointment. Karin Golden  [phone].

## 2011-04-01 NOTE — Telephone Encounter (Signed)
I spoke with pharmacy and they will fill for the ointment and Dr. Clent Ridges did approve.

## 2011-04-25 ENCOUNTER — Other Ambulatory Visit: Payer: Self-pay | Admitting: Internal Medicine

## 2011-04-29 ENCOUNTER — Telehealth: Payer: Self-pay | Admitting: Internal Medicine

## 2011-04-29 NOTE — Telephone Encounter (Signed)
Pts spouse called and said that pt only rcvd # 30 of the Atorvastatin. Pt is req a 90 day supply to be called in to [facility] [phone]

## 2011-05-03 MED ORDER — ATORVASTATIN CALCIUM 10 MG PO TABS: 10.0000 mg | ORAL_TABLET | Freq: Every day | ORAL | Status: DC

## 2011-05-03 NOTE — Telephone Encounter (Signed)
rx sent to pharmacy

## 2011-05-06 ENCOUNTER — Telehealth: Payer: Self-pay | Admitting: Internal Medicine

## 2011-05-06 MED ORDER — ATORVASTATIN CALCIUM 10 MG PO TABS: 10.0000 mg | ORAL_TABLET | Freq: Every day | ORAL | Status: DC

## 2011-05-06 NOTE — Telephone Encounter (Signed)
Needs new rx for Atorvastatin  sent to new pharmacy---[facility]. Thanks.

## 2011-05-19 ENCOUNTER — Ambulatory Visit (INDEPENDENT_AMBULATORY_CARE_PROVIDER_SITE_OTHER): Payer: Medicare Other | Admitting: Internal Medicine

## 2011-05-19 ENCOUNTER — Encounter: Payer: Self-pay | Admitting: Internal Medicine

## 2011-05-19 VITALS — BP 138/70 | HR 100 | Wt 142.0 lb

## 2011-05-19 DIAGNOSIS — S98139A Complete traumatic amputation of one unspecified lesser toe, initial encounter: Secondary | ICD-10-CM

## 2011-05-19 DIAGNOSIS — E1142 Type 2 diabetes mellitus with diabetic polyneuropathy: Secondary | ICD-10-CM

## 2011-05-19 DIAGNOSIS — R269 Unspecified abnormalities of gait and mobility: Secondary | ICD-10-CM

## 2011-05-19 DIAGNOSIS — E1149 Type 2 diabetes mellitus with other diabetic neurological complication: Secondary | ICD-10-CM

## 2011-05-19 DIAGNOSIS — R413 Other amnesia: Secondary | ICD-10-CM

## 2011-05-19 DIAGNOSIS — IMO0002 Reserved for concepts with insufficient information to code with codable children: Secondary | ICD-10-CM

## 2011-05-19 DIAGNOSIS — G609 Hereditary and idiopathic neuropathy, unspecified: Secondary | ICD-10-CM

## 2011-05-19 DIAGNOSIS — I10 Essential (primary) hypertension: Secondary | ICD-10-CM

## 2011-05-19 DIAGNOSIS — E785 Hyperlipidemia, unspecified: Secondary | ICD-10-CM

## 2011-05-19 LAB — MICROALBUMIN / CREATININE URINE RATIO
Creatinine,U: 219.5 mg/dL
Microalb Creat Ratio: 0.9 mg/g (ref 0.0–30.0)

## 2011-05-19 LAB — BASIC METABOLIC PANEL
CO2: 27 mEq/L (ref 19–32)
Calcium: 9.8 mg/dL (ref 8.4–10.5)
Creatinine, Ser: 0.7 mg/dL (ref 0.4–1.2)

## 2011-05-19 LAB — HEMOGLOBIN A1C: Hgb A1c MFr Bld: 6.9 % — ABNORMAL HIGH (ref 4.6–6.5)

## 2011-05-19 MED ORDER — DONEPEZIL HCL 10 MG PO TABS: 10.0000 mg | ORAL_TABLET | Freq: Every day | ORAL | Status: DC

## 2011-05-19 NOTE — Patient Instructions (Signed)
Will notify you  of labs when available. No change in meds at this time.  Have the nurse send [place] a note about your feet and ulcer doing ok.  If doing well then ROV in about 4-6 months

## 2011-05-19 NOTE — Progress Notes (Signed)
Subjective:    Patient ID: Natasha West, female    DOB: May 03, 1927, 78 y.o.   MRN: [id number]  HPI  Patient comes in today for follow up of  multiple medical problems.   With daughter . No major change in health status since last visit . Did see dr Clent Ridges 2 months ago for minor ulcer on toe that apparently is being checked buy nurses at facility  Of ? hh pt states is doing well   Husband is helping the bg monitoring.    No lows now off the amaryl. Reported as good.  Appetite food. NOt as good as  She would like otherwise. Some weight loss.  Cause of food.  No NVD bleeding known No recent falling.  Uses rolling walker    No change. Bp reported as good but not confirmed. No recent eye check. No CO Memory dementia  Saw dr Anne Hahn no intervention except aricept and noted some help with memory .  No se reported.  Outpatient Encounter Prescriptions as of 05/19/2011  Medication Sig Dispense Refill  . aspirin 81 MG chewable tablet Chew 81 mg by mouth daily.        [name] atorvastatin (LIPITOR) 10 MG tablet Take 1 tablet (10 mg total) by mouth daily.  90 tablet  0  . calcium-vitamin D (OSCAL WITH D) 250-125 MG-UNIT per tablet Take 1 tablet by mouth daily.      [name] donepezil (ARICEPT) 10 MG tablet 1 tab at bedtime.      . metFORMIN (GLUCOPHAGE-XR) 500 MG 24 hr tablet TAKE 1 TABLET TWICE A DAY  180 tablet  2  . multivitamin (THERAGRAN) per tablet Take 1 tablet by mouth daily.        . Nutritional Supplements (JUICE PLUS FIBRE PO) Take by mouth.      . vitamin B-12 (CYANOCOBALAMIN) 100 MCG tablet Take 500 mcg by mouth daily. 1000 daily       . DISCONTD: donepezil (ARICEPT) 10 MG tablet 1 tab at bedtime. Pt needs to schedule a follow up appt before next refill.  30 tablet  1  . Ascorbic Acid (VITAMIN C) 100 MG tablet Take 100 mg by mouth daily.        [name] DISCONTD: glimepiride (AMARYL) 2 MG tablet Take 2 mg by mouth daily before breakfast.        . DISCONTD: metFORMIN (GLUCOPHAGE) 500 MG tablet Take 500 mg by  mouth 2 (two) times daily with a meal.          Review of Systems Neg cp sob  bleeidng current pain although is taking 1/2 vicodin bid   Unsure which md is prescriber but resumed for  DJD etc which she has .   Hearing reported as ok.  Recently had a prolonged cold that took a while but cough resolved   Past history family history social history reviewed in the electronic medical record.     Objective:   Physical Exam BP readings  Right 130/70  Seated  WDWN in nad alert well groomed  Pleasant.   Pretty briskly limping gait not antalgic  No change. Walks with rolling walker.  HEENT grossly non focal   Ears  Normal  Neck: Supple without adenopathy or masses or bruits Chest:  Clear to A&P without wheezes rales or rhonchi CV:  S1-S2 no gallops or murmurs peripheral perfusion is normal EXTR  Pt declined to take off shoe and unwrap  And said that nurse checks it daily.  Skin  No acute rash or bleeding      Assessment & Plan:  DM with neuropathy hxof partial foot toes amputation No infection and hypoglycmeia and renal fucntion is good    Will have nurse send a not about how foot is looking as pt did not want to get Unwrapped today" Check lab today and urine microalbumin  BP  normal on repeat  Monitor check as needed.  DJD assuming on pain pill for this low dose no ob se  . Get name of prescriber  . Not in EHR   Cognitive impairment  Dementia early/    aricept is helpful.  Ok to refill  90 x 3 sent in  LIPIDS:    No se noted of med

## 2011-05-20 ENCOUNTER — Encounter: Payer: Self-pay | Admitting: Internal Medicine

## 2011-05-20 NOTE — Progress Notes (Signed)
Quick Note:  Pt's daughter aware of results. ______ 

## 2011-07-22 ENCOUNTER — Other Ambulatory Visit (HOSPITAL_BASED_OUTPATIENT_CLINIC_OR_DEPARTMENT_OTHER): Payer: Self-pay | Admitting: General Surgery

## 2011-07-22 ENCOUNTER — Ambulatory Visit (HOSPITAL_COMMUNITY)
Admission: RE | Admit: 2011-07-22 | Discharge: 2011-07-22 | Disposition: A | Discharge status: Home / Self Care | Payer: Medicare Other | Source: Ambulatory Visit | Attending: General Surgery | Admitting: General Surgery

## 2011-07-22 ENCOUNTER — Encounter (HOSPITAL_BASED_OUTPATIENT_CLINIC_OR_DEPARTMENT_OTHER): Payer: Medicare Other | Attending: General Surgery

## 2011-07-22 DIAGNOSIS — I10 Essential (primary) hypertension: Secondary | ICD-10-CM | POA: Insufficient documentation

## 2011-07-22 DIAGNOSIS — E119 Type 2 diabetes mellitus without complications: Secondary | ICD-10-CM | POA: Insufficient documentation

## 2011-07-22 DIAGNOSIS — S98119A Complete traumatic amputation of unspecified great toe, initial encounter: Secondary | ICD-10-CM | POA: Insufficient documentation

## 2011-07-22 DIAGNOSIS — E1169 Type 2 diabetes mellitus with other specified complication: Secondary | ICD-10-CM | POA: Insufficient documentation

## 2011-07-22 DIAGNOSIS — M869 Osteomyelitis, unspecified: Secondary | ICD-10-CM

## 2011-07-22 DIAGNOSIS — S98139A Complete traumatic amputation of one unspecified lesser toe, initial encounter: Secondary | ICD-10-CM | POA: Insufficient documentation

## 2011-07-22 DIAGNOSIS — L97509 Non-pressure chronic ulcer of other part of unspecified foot with unspecified severity: Secondary | ICD-10-CM | POA: Insufficient documentation

## 2011-07-22 NOTE — Progress Notes (Signed)
[facility]  NAME:  Natasha West, Natasha West NO.:  [id number]  MEDICAL RECORD NO.:  [id number]      DATE OF BIRTH:  January 05, 1928  PHYSICIAN:  Ardath Sax, M.D.           VISIT DATE:                                  OFFICE VISIT   This is a very nice 78 year old lady who is a diabetic and has been for years, but it is controlled very nicely with metformin.  She has had a problem with her right foot and slowly she has lost all of the toes on her right foot, the first being the great toe which was removed and then later as they became devascularized, she had the other 4 toes amputated. She comes to the [facility] now because she has an ulcer right over the first metatarsal head, which is about a 0.5 cm in diameter.  It probably has an osteo.  We ordered an x-ray.  I started her on doxycycline.  I am going to have the Ortho consult to see her in relation to taking the first metatarsal head back some and closing this wound.  In the meantime, I put her on doxycycline and got a culture and will make plans for HBO as this is a Wagner 3 ulcer and she is going through the throes of amputation and I think if we are going to save her foot, we better do everything now, so I am getting an Ortho consult and x-ray, starting her on doxycycline.  We will apply for HBO in order to try to save this even if it is after the head of the first metatarsal is amputated.  Other than that, she is a very active bright 78 year old lady who lives with her husband and she is ambulating on this foot.  We packed the wound today with collagen and wrapped it in a Kerlix, and I will see her next week.     Ardath Sax, M.D.     PP/MEDQ  D:  07/22/2011  T:  07/22/2011  Job:  [id number]

## 2011-08-01 ENCOUNTER — Telehealth: Payer: Self-pay | Admitting: *Deleted

## 2011-08-01 NOTE — Telephone Encounter (Signed)
Dr. Fabian Sharp discussed the situation with Dr. Lovell Sheehan and copies were made of pt's xrays and recent office visits to the [facility] Center for him to investigate.  The chest xray was not ordered by Dr. Fabian Sharp.

## 2011-08-01 NOTE — Telephone Encounter (Addendum)
Wound center is calling re: pt's recent chest xrays, and they have been abnormal.  The Wound center orders these xrays routinely, and cannot do anymore treatments with out the sign off of PCP.  Please have Dr. Fabian Sharp review these and let the Wound Center know what she suggests??

## 2011-08-02 NOTE — Telephone Encounter (Signed)
I will have to review record   But I didn't order x ray and dont know the clinical situation and hx  And why the x ray was done.

## 2011-08-03 ENCOUNTER — Other Ambulatory Visit: Payer: Self-pay

## 2011-08-03 MED ORDER — ATORVASTATIN CALCIUM 10 MG PO TABS: 10.0000 mg | ORAL_TABLET | Freq: Every day | ORAL | Status: DC

## 2011-08-03 NOTE — Telephone Encounter (Signed)
Rx sent to [facility] for Lipitor.

## 2011-08-12 ENCOUNTER — Encounter (HOSPITAL_BASED_OUTPATIENT_CLINIC_OR_DEPARTMENT_OTHER): Payer: Medicare Other | Attending: General Surgery

## 2011-08-12 DIAGNOSIS — S98139A Complete traumatic amputation of one unspecified lesser toe, initial encounter: Secondary | ICD-10-CM | POA: Insufficient documentation

## 2011-08-12 DIAGNOSIS — E1169 Type 2 diabetes mellitus with other specified complication: Secondary | ICD-10-CM | POA: Insufficient documentation

## 2011-08-12 DIAGNOSIS — S98119A Complete traumatic amputation of unspecified great toe, initial encounter: Secondary | ICD-10-CM | POA: Insufficient documentation

## 2011-08-12 DIAGNOSIS — L97509 Non-pressure chronic ulcer of other part of unspecified foot with unspecified severity: Secondary | ICD-10-CM | POA: Insufficient documentation

## 2011-08-19 ENCOUNTER — Encounter (HOSPITAL_BASED_OUTPATIENT_CLINIC_OR_DEPARTMENT_OTHER): Payer: Medicare Other

## 2011-09-06 ENCOUNTER — Other Ambulatory Visit: Payer: Self-pay | Admitting: Orthopedic Surgery

## 2011-09-07 ENCOUNTER — Encounter (HOSPITAL_BASED_OUTPATIENT_CLINIC_OR_DEPARTMENT_OTHER): Payer: Self-pay | Admitting: *Deleted

## 2011-09-07 NOTE — Progress Notes (Signed)
Talked with daughter-aware of where to come-talked with husband-all preop instructions reviewed Needs istat

## 2011-09-09 ENCOUNTER — Encounter (HOSPITAL_BASED_OUTPATIENT_CLINIC_OR_DEPARTMENT_OTHER): Payer: Self-pay | Admitting: Certified Registered Nurse Anesthetist

## 2011-09-09 ENCOUNTER — Ambulatory Visit (HOSPITAL_BASED_OUTPATIENT_CLINIC_OR_DEPARTMENT_OTHER): Payer: Medicare Other | Admitting: Certified Registered Nurse Anesthetist

## 2011-09-09 ENCOUNTER — Ambulatory Visit (HOSPITAL_BASED_OUTPATIENT_CLINIC_OR_DEPARTMENT_OTHER)
Admission: RE | Admit: 2011-09-09 | Discharge: 2011-09-09 | Disposition: A | Discharge status: Home / Self Care | Payer: Medicare Other | Source: Ambulatory Visit | Attending: Orthopedic Surgery | Admitting: Orthopedic Surgery

## 2011-09-09 ENCOUNTER — Encounter (HOSPITAL_BASED_OUTPATIENT_CLINIC_OR_DEPARTMENT_OTHER): Payer: Self-pay | Admitting: *Deleted

## 2011-09-09 ENCOUNTER — Encounter (HOSPITAL_BASED_OUTPATIENT_CLINIC_OR_DEPARTMENT_OTHER)
Admission: RE | Disposition: A | Discharge status: Home / Self Care | Payer: Self-pay | Source: Ambulatory Visit | Attending: Orthopedic Surgery

## 2011-09-09 DIAGNOSIS — T8789 Other complications of amputation stump: Secondary | ICD-10-CM | POA: Insufficient documentation

## 2011-09-09 DIAGNOSIS — Y835 Amputation of limb(s) as the cause of abnormal reaction of the patient, or of later complication, without mention of misadventure at the time of the procedure: Secondary | ICD-10-CM | POA: Insufficient documentation

## 2011-09-09 DIAGNOSIS — S91301A Unspecified open wound, right foot, initial encounter: Secondary | ICD-10-CM

## 2011-09-09 DIAGNOSIS — G609 Hereditary and idiopathic neuropathy, unspecified: Secondary | ICD-10-CM | POA: Insufficient documentation

## 2011-09-09 DIAGNOSIS — E785 Hyperlipidemia, unspecified: Secondary | ICD-10-CM | POA: Insufficient documentation

## 2011-09-09 DIAGNOSIS — I10 Essential (primary) hypertension: Secondary | ICD-10-CM | POA: Insufficient documentation

## 2011-09-09 DIAGNOSIS — E119 Type 2 diabetes mellitus without complications: Secondary | ICD-10-CM | POA: Insufficient documentation

## 2011-09-09 DIAGNOSIS — Z89439 Acquired absence of unspecified foot: Secondary | ICD-10-CM

## 2011-09-09 HISTORY — PX: AMPUTATION: SHX166

## 2011-09-09 HISTORY — DX: Unspecified open wound, right foot, initial encounter: S91.301A

## 2011-09-09 LAB — POCT I-STAT, CHEM 8
Glucose, Bld: 131 mg/dL — ABNORMAL HIGH (ref 70–99)
HCT: 41 % (ref 36.0–46.0)
Hemoglobin: 13.9 g/dL (ref 12.0–15.0)
Potassium: 4 mEq/L (ref 3.5–5.1)
TCO2: 26 mmol/L (ref 0–100)

## 2011-09-09 LAB — GLUCOSE, CAPILLARY: Glucose-Capillary: 99 mg/dL (ref 70–99)

## 2011-09-09 SURGERY — AMPUTATION DIGIT
Anesthesia: Monitor Anesthesia Care | Site: Foot | Laterality: Right | Wound class: Contaminated

## 2011-09-09 MED ORDER — LIDOCAINE HCL (CARDIAC) 20 MG/ML IV SOLN
INTRAVENOUS | Status: DC | PRN
  Administered 2011-09-09: 30 mg via INTRAVENOUS

## 2011-09-09 MED ORDER — OXYCODONE-ACETAMINOPHEN 5-325 MG PO TABS: 1.0000 | ORAL_TABLET | Freq: Four times a day (QID) | ORAL | Status: AC | PRN

## 2011-09-09 MED ORDER — ONDANSETRON HCL 4 MG/2ML IJ SOLN: 4.0000 mg | Freq: Once | INTRAMUSCULAR | Status: DC | PRN

## 2011-09-09 MED ORDER — POVIDONE-IODINE 7.5 % EX SOLN: Freq: Once | CUTANEOUS | Status: DC

## 2011-09-09 MED ORDER — OXYCODONE HCL 5 MG PO TABS: 5.0000 mg | ORAL_TABLET | Freq: Once | ORAL | Status: DC | PRN

## 2011-09-09 MED ORDER — CLINDAMYCIN PHOSPHATE 600 MG/50ML IV SOLN
600.0000 mg | INTRAVENOUS | Status: AC
  Administered 2011-09-09: 600 mg via INTRAVENOUS

## 2011-09-09 MED ORDER — CLINDAMYCIN PHOSPHATE 600 MG/50ML IV SOLN
600.0000 mg | Freq: Once | INTRAVENOUS | Status: AC
  Administered 2011-09-09: 600 mg via INTRAVENOUS

## 2011-09-09 MED ORDER — ONDANSETRON HCL 4 MG/2ML IJ SOLN
INTRAMUSCULAR | Status: DC | PRN
  Administered 2011-09-09: 4 mg via INTRAVENOUS

## 2011-09-09 MED ORDER — LACTATED RINGERS IV SOLN
INTRAVENOUS | Status: DC
  Administered 2011-09-09: 11:00:00 via INTRAVENOUS

## 2011-09-09 MED ORDER — DOXYCYCLINE HYCLATE 100 MG PO TABS: 100.0000 mg | ORAL_TABLET | Freq: Two times a day (BID) | ORAL | Status: AC

## 2011-09-09 MED ORDER — FENTANYL CITRATE 0.05 MG/ML IJ SOLN: 25.0000 ug | INTRAMUSCULAR | Status: DC | PRN

## 2011-09-09 MED ORDER — PROPOFOL 10 MG/ML IV EMUL
INTRAVENOUS | Status: DC | PRN
  Administered 2011-09-09: 50 ug/kg/min via INTRAVENOUS

## 2011-09-09 SURGICAL SUPPLY — 67 items
BANDAGE ELASTIC 3 VELCRO ST LF (GAUZE/BANDAGES/DRESSINGS) ×2 IMPLANT
BANDAGE ELASTIC 4 VELCRO ST LF (GAUZE/BANDAGES/DRESSINGS) IMPLANT
BANDAGE ELASTIC 6 VELCRO ST LF (GAUZE/BANDAGES/DRESSINGS) IMPLANT
BANDAGE ESMARK 6X9 LF (GAUZE/BANDAGES/DRESSINGS) IMPLANT
BANDAGE GAUZE ELAST BULKY 4 IN (GAUZE/BANDAGES/DRESSINGS) ×2 IMPLANT
BENZOIN TINCTURE PRP APPL 2/3 (GAUZE/BANDAGES/DRESSINGS) IMPLANT
BLADE AVERAGE 25X9 (BLADE) ×2 IMPLANT
BLADE SURG 15 STRL LF DISP TIS (BLADE) ×1 IMPLANT
BLADE SURG 15 STRL SS (BLADE) ×1
BNDG ESMARK 4X9 LF (GAUZE/BANDAGES/DRESSINGS) ×2 IMPLANT
BNDG ESMARK 6X9 LF (GAUZE/BANDAGES/DRESSINGS)
CANISTER SUCTION 1200CC (MISCELLANEOUS) IMPLANT
COVER MAYO STAND STRL (DRAPES) ×2 IMPLANT
COVER TABLE BACK 60X90 (DRAPES) ×2 IMPLANT
DECANTER SPIKE VIAL GLASS SM (MISCELLANEOUS) IMPLANT
DRAPE EXTREMITY T 121X128X90 (DRAPE) ×2 IMPLANT
DRAPE OEC MINIVIEW 54X84 (DRAPES) ×2 IMPLANT
DRAPE SURG 17X23 STRL (DRAPES) ×2 IMPLANT
DRSG EMULSION OIL 3X3 NADH (GAUZE/BANDAGES/DRESSINGS) IMPLANT
DURAPREP 26ML APPLICATOR (WOUND CARE) IMPLANT
ELECT NEEDLE TIP 2.8 STRL (NEEDLE) IMPLANT
ELECT REM PT RETURN 9FT ADLT (ELECTROSURGICAL) ×2
ELECTRODE REM PT RTRN 9FT ADLT (ELECTROSURGICAL) ×1 IMPLANT
GAUZE XEROFORM 1X8 LF (GAUZE/BANDAGES/DRESSINGS) ×2 IMPLANT
GLOVE BIO SURGEON STRL SZ 6.5 (GLOVE) ×4 IMPLANT
GLOVE BIO SURGEON STRL SZ7.5 (GLOVE) ×2 IMPLANT
GLOVE BIOGEL M 7.0 STRL (GLOVE) ×2 IMPLANT
GLOVE BIOGEL PI IND STRL 7.0 (GLOVE) ×2 IMPLANT
GLOVE BIOGEL PI IND STRL 8 (GLOVE) ×2 IMPLANT
GLOVE BIOGEL PI INDICATOR 7.0 (GLOVE) ×2
GLOVE BIOGEL PI INDICATOR 8 (GLOVE) ×2
GLOVE ECLIPSE 7.5 STRL STRAW (GLOVE) ×2 IMPLANT
GOWN PREVENTION PLUS XLARGE (GOWN DISPOSABLE) IMPLANT
GOWN PREVENTION PLUS XXLARGE (GOWN DISPOSABLE) IMPLANT
NEEDLE HYPO 25X1 1.5 SAFETY (NEEDLE) IMPLANT
NS IRRIG 1000ML POUR BTL (IV SOLUTION) ×2 IMPLANT
PACK BASIN DAY SURGERY FS (CUSTOM PROCEDURE TRAY) ×2 IMPLANT
PAD CAST 4YDX4 CTTN HI CHSV (CAST SUPPLIES) IMPLANT
PADDING CAST ABS 4INX4YD NS (CAST SUPPLIES) ×1
PADDING CAST ABS COTTON 4X4 ST (CAST SUPPLIES) ×1 IMPLANT
PADDING CAST COTTON 4X4 STRL (CAST SUPPLIES)
PADDING CAST COTTON 6X4 STRL (CAST SUPPLIES) IMPLANT
PENCIL BUTTON HOLSTER BLD 10FT (ELECTRODE) ×2 IMPLANT
SPONGE GAUZE 4X4 12PLY (GAUZE/BANDAGES/DRESSINGS) ×2 IMPLANT
STOCKINETTE 4X48 STRL (DRAPES) IMPLANT
STOCKINETTE 6  STRL (DRAPES) ×1
STOCKINETTE 6 STRL (DRAPES) ×1 IMPLANT
STRIP CLOSURE SKIN 1/2X4 (GAUZE/BANDAGES/DRESSINGS) IMPLANT
SUCTION FRAZIER TIP 10 FR DISP (SUCTIONS) IMPLANT
SUT ETHILON 2 0 FS 18 (SUTURE) ×6 IMPLANT
SUT ETHILON 3 0 FSL (SUTURE) IMPLANT
SUT ETHILON 4 0 PS 2 18 (SUTURE) IMPLANT
SUT MNCRL AB 3-0 PS2 18 (SUTURE) IMPLANT
SUT VIC AB 0 CT1 27 (SUTURE)
SUT VIC AB 0 CT1 27XBRD ANBCTR (SUTURE) IMPLANT
SUT VIC AB 2-0 CT1 27 (SUTURE)
SUT VIC AB 2-0 CT1 TAPERPNT 27 (SUTURE) IMPLANT
SUT VIC AB 2-0 SH 27 (SUTURE)
SUT VIC AB 2-0 SH 27XBRD (SUTURE) IMPLANT
SUT VICRYL 3-0 CR8 SH (SUTURE) IMPLANT
SYR BULB 3OZ (MISCELLANEOUS) ×2 IMPLANT
SYR CONTROL 10ML LL (SYRINGE) IMPLANT
TOWEL OR 17X24 6PK STRL BLUE (TOWEL DISPOSABLE) IMPLANT
TRAY DSU PREP LF (CUSTOM PROCEDURE TRAY) ×2 IMPLANT
TUBE CONNECTING 20X1/4 (TUBING) IMPLANT
UNDERPAD 30X30 INCONTINENT (UNDERPADS AND DIAPERS) ×2 IMPLANT
WATER STERILE IRR 1000ML POUR (IV SOLUTION) IMPLANT

## 2011-09-09 NOTE — Op Note (Signed)
Natasha West, Natasha West               ACCOUNT NO.:  [id number]  MEDICAL RECORD NO.:  [id number]  LOCATION:  FOOT                         FACILITY:  [facility]  PHYSICIAN:  Harvie Junior, M.D.   DATE OF BIRTH:  10/07/1927  DATE OF PROCEDURE:  09/09/2011 DATE OF DISCHARGE:                              OPERATIVE REPORT   PREOPERATIVE DIAGNOSIS:  Failed transmetatarsal amputation with open wound over second metatarsal shaft.  POSTOPERATIVE DIAGNOSIS:  Failed transmetatarsal amputation with open wound over second metatarsal shaft.  PRINCIPAL PROCEDURE:  Revision of transmetatarsal amputation.  SURGEON:  Harvie Junior, MD  ASSISTANT:  Marshia Ly, PA  ANESTHESIA:  Ankle block.  BRIEF HISTORY:  [name] is an 78 year old female with a history of having had transmetatarsal amputation.  Ultimately, she unfortunately had a breakdown of the wound over that area, was seen in the [facility]ound Clinic for prolonged period of time after failure of all conservative care to control the wound management.  Discussion with the doctors in the Wound Center felt that shortening of the metatarsal was necessary and after failure of all conservative care, she was taken to the operating room for this procedure.  Preoperative evaluation showed that if we shortened the metatarsal, we were going to leave a long metatarsal next to it.  So, we felt that we were going to have to do essentially revision of the transmetatarsal amputation to allow her for ability to heal.  She was brought to the operating room for this procedure.  PROCEDURE:  The patient was brought to the operating room.  After adequate anesthesia was obtained with an ankle block and a MAC, the patient was placed supine on the operating table.  The right leg was prepped and draped in usual sterile fashion.  Following this, the leg was exsanguinated, blood pressure tourniquet was inflated to 250 mmHg. Following this, incision was made through the old  incision and we ellipsed out the open chronic wound, and the subcutaneous tissue at the level of the metatarsal head, they were all identified and then under fluoroscopic guidance to get the angle right, we used a saw to resect the 2 through 5 metatarsal heads, these were rounded with a rongeur.  Wound was copiously and thoroughly irrigated, suctioned dry and then closed with nylon interrupted sutures.  Sterile compressive dressing was applied.  The patient was taken to the recovery room, she was noted to be in satisfactory condition.  Estimated blood loss for the procedure was minimal.     Harvie Junior, M.D.     Ranae Plumber  D:  09/09/2011  T:  09/09/2011  Job:  [id number]

## 2011-09-09 NOTE — Discharge Instructions (Signed)
Ambulate Wgt bearing as tolerated on the right putting wgt on heel. Elevate your foot as much as tolerated. Use pain meds as needed.  Post Anesthesia Home Care Instructions  Activity: Get plenty of rest for the remainder of the day. A responsible adult should stay with you for 24 hours following the procedure.  For the next 24 hours, DO NOT: -Drive a car -Advertising copywriter -Drink alcoholic beverages -Take any medication unless instructed by your physician -Make any legal decisions or sign important papers.  Meals: Start with liquid foods such as gelatin or soup. Progress to regular foods as tolerated. Avoid greasy, spicy, heavy foods. If nausea and/or vomiting occur, drink only clear liquids until the nausea and/or vomiting subsides. Call your physician if vomiting continues.  Special Instructions/Symptoms: Your throat may feel dry or sore from the anesthesia or the breathing tube placed in your throat during surgery. If this causes discomfort, gargle with warm salt water. The discomfort should disappear within 24 hours.   Call your surgeon if you experience:   1.  Fever over 101.0. 2.  Inability to urinate. 3.  Nausea and/or vomiting. 4.  Extreme swelling or bruising at the surgical site. 5.  Continued bleeding from the incision. 6.  Increased pain, redness or drainage from the incision. 7.  Problems related to your pain medication.   Regional Anesthesia Blocks  1. Numbness or the inability to move the "blocked" extremity may last from 3-48 hours after placement. The length of time depends on the medication injected and your individual response to the medication. If the numbness is not going away after 48 hours, call your surgeon.  2. The extremity that is blocked will need to be protected until the numbness is gone and the  Strength has returned. Because you cannot feel it, you will need to take extra care to avoid injury. Because it may be weak, you may have difficulty moving it  or using it. You may not know what position it is in without looking at it while the block is in effect.  3. For blocks in the legs and feet, returning to weight bearing and walking needs to be done carefully. You will need to wait until the numbness is entirely gone and the strength has returned. You should be able to move your leg and foot normally before you try and bear weight or walk. You will need someone to be with you when you first try to ensure you do not fall and possibly risk injury.  4. Bruising and tenderness at the needle site are common side effects and will resolve in a few days.  5. Persistent numbness or new problems with movement should be communicated to the surgeon or the [facility] [phone] [facility] [phone]).

## 2011-09-09 NOTE — Progress Notes (Signed)
Assisted Dr. Joslin with right, ankle block. Side rails up, monitors on throughout procedure. See vital signs in flow sheet. Tolerated Procedure well. 

## 2011-09-09 NOTE — Transfer of Care (Signed)
Immediate Anesthesia Transfer of Care Note  Patient: Natasha West  Procedure(s) Performed: Procedure(s) (LRB): AMPUTATION DIGIT (Right)  Patient Location: PACU  Anesthesia Type: MAC combined with regional for post-op pain  Level of Consciousness: awake, alert , oriented and patient cooperative  Airway & Oxygen Therapy: Patient Spontanous Breathing and Patient connected to face mask oxygen  Post-op Assessment: Report given to PACU RN and Post -op Vital signs reviewed and stable  Post vital signs: Reviewed and stable  Complications: No apparent anesthesia complications

## 2011-09-09 NOTE — Brief Op Note (Signed)
09/09/2011  1:06 PM  PATIENT:  Natasha West  78 y.o. female  PRE-OPERATIVE DIAGNOSIS:  open wound right foot  POST-OPERATIVE DIAGNOSIS:  open wound right foot  PROCEDURE:  Procedure(s) (LRB): AMPUTATION DIGIT (Right)  SURGEON:  Surgeon(s) and Role:    * Harvie Junior, MD - Primary  PHYSICIAN ASSISTANT:   ASSISTANTS: bethune   ANESTHESIA:   regional  EBL:  Total I/O In: 918 [P.O.:118; I.V.:800] Out: -   BLOOD ADMINISTERED:none  DRAINS: none   LOCAL MEDICATIONS USED:  NONE  SPECIMEN:  No Specimen  DISPOSITION OF SPECIMEN:  N/A  COUNTS:  YES  TOURNIQUET:   Total Tourniquet Time Documented: Calf (Right) - 15 minutes  DICTATION: .Other Dictation: Dictation Number [id number]  PLAN OF CARE: Discharge to home after PACU  PATIENT DISPOSITION:  PACU - hemodynamically stable.   Delay start of Pharmacological VTE agent (>24hrs) due to surgical blood loss or risk of bleeding: not applicable

## 2011-09-09 NOTE — Anesthesia Postprocedure Evaluation (Signed)
  Anesthesia Post-op Note  Patient: Natasha West  Procedure(s) Performed: Procedure(s) (LRB): AMPUTATION DIGIT (Right)  Patient Location: PACU  Anesthesia Type: MAC  Level of Consciousness: awake and oriented  Airway and Oxygen Therapy: Patient Spontanous Breathing and Patient connected to nasal cannula oxygen  Post-op Pain: none  Post-op Assessment: Post-op Vital signs reviewed and Patient's Cardiovascular Status Stable  Post-op Vital Signs: stable  Complications: No apparent anesthesia complications

## 2011-09-09 NOTE — Anesthesia Preprocedure Evaluation (Addendum)
Anesthesia Evaluation  Patient identified by MRN, date of birth, ID band Patient awake    Reviewed: Allergy & Precautions, H&P , NPO status , Patient's Chart, lab work & pertinent test results  Airway Mallampati: II TM Distance: >3 FB Neck ROM: Full    Dental  (+) Partial Lower and Partial Upper   Pulmonary  breath sounds clear to auscultation        Cardiovascular Rhythm:Regular Rate:Normal     Neuro/Psych    GI/Hepatic   Endo/Other    Renal/GU      Musculoskeletal   Abdominal   Peds  Hematology   Anesthesia Other Findings   Reproductive/Obstetrics                           Anesthesia Physical Anesthesia Plan  ASA: III  Anesthesia Plan: MAC   Post-op Pain Management:    Induction: Intravenous  Airway Management Planned: Nasal Cannula  Additional Equipment:   Intra-op Plan:   Post-operative Plan:   Informed Consent: I have reviewed the patients History and Physical, chart, labs and discussed the procedure including the risks, benefits and alternatives for the proposed anesthesia with the patient or authorized representative who has indicated his/her understanding and acceptance.   Dental advisory given  Plan Discussed with:   Anesthesia Plan Comments: (Type 2 DM Glucose 163 Htn Dementia Nonhealing R. Forefoot amputation  Plan ankle block with minimal sedation.  Kipp Brood, MD)        Anesthesia Quick Evaluation

## 2011-09-09 NOTE — H&P (Signed)
PREOPERATIVE H&P  Chief Complaint: open wound over r. Foot previous amputation  HPI: Natasha West is a 78 y.o. female who presents for evaluation of r foot drainage and open wound. It has been present for greater than 3 mon. and has been worsening. She has failed conservative measures. Pain is rated as mild.  Past Medical History  Diagnosis Date  . Diabetes mellitus   . Hyperlipidemia   . Hypertension   . Peripheral neuropathy     partial amputation rt foot  . Memory difficulty   . Foot infection     with sepsis  and amputation toes right   Past Surgical History  Procedure Date  . Total knee arthroplasty   . Elbow fracture surgery     left with orif  . Amputation 2009    from gangrene rt 4th toe Regal  . Amputation 03 15 2010    Rt great toe and rt distal phalanx  . Toe amputation 1 12    right 3rd for ulcer  Shirly Bartosiewicz   . Right amputation 7 12     with sepsis  . Appendectomy    History   Social History  . Marital Status: Married    Spouse Name: N/A    Number of Children: N/A  . Years of Education: N/A   Social History Main Topics  . Smoking status: Former Smoker    Quit date: 09/07/1991  . Smokeless tobacco: Never Used  . Alcohol Use: No  . Drug Use: No  . Sexually Active: None   Other Topics Concern  . None   Social History Narrative   Married Husband has vision problem and recurrent falls[id number] back to [place] after [employer] is prepared for them. 2 daughters checking on them1 help her who comes in to help bathe and dress patient   Family History  Problem Relation Age of Onset  . Alzheimer's disease Mother   . Other Father     cva endarterectomy  . Diabetes type II Daughter    Allergies  Allergen Reactions  . Penicillins     REACTION: unspecified   Prior to Admission medications   Medication Sig Start Date End Date Taking? Authorizing Provider  Ascorbic Acid  (VITAMIN C) 100 MG tablet Take 100 mg by mouth daily.     Yes Historical Provider, MD  aspirin 81 MG chewable tablet Chew 81 mg by mouth daily.     Yes Historical Provider, MD  atorvastatin (LIPITOR) 10 MG tablet Take 1 tablet (10 mg total) by mouth daily. 08/03/11  Yes Madelin Headings, MD  calcium-vitamin D (OSCAL WITH D) 250-125 MG-UNIT per tablet Take 1 tablet by mouth daily.   Yes Historical Provider, MD  donepezil (ARICEPT) 10 MG tablet Take 1 tablet (10 mg total) by mouth at bedtime. 1 tab at bedtime. 05/19/11 05/18/12 Yes Madelin Headings, MD  HYDROcodone-acetaminophen (VICODIN) 5-500 MG per tablet 1/2 po bid ? Provider 03/28/11  Yes York Spaniel, MD  metFORMIN (GLUCOPHAGE-XR) 500 MG 24 hr tablet TAKE 1 TABLET TWICE A DAY 02/07/11  Yes Madelin Headings, MD  multivitamin [facility]) per tablet Take 1 tablet by mouth daily.     Yes Historical Provider, MD  Nutritional Supplements (JUICE PLUS FIBRE PO) Take by mouth.   Yes Historical Provider, MD  vitamin B-12 (CYANOCOBALAMIN) 100 MCG tablet Take 500 mcg by mouth daily. 1000 daily    Yes Historical Provider, MD  Positive ROS: none  All other systems have been reviewed and were otherwise negative with the exception of those mentioned in the HPI and as above.  Physical Exam: Filed Vitals:   09/09/11 1030  BP:   Pulse: 101  Temp:   Resp: 20    General: Alert, no acute distress Cardiovascular: No pedal edema Respiratory: No cyanosis, no use of accessory musculature GI: No organomegaly, abdomen is soft and non-tender Skin: No lesions in the area of chief complaint Neurologic: Sensation intact distally Psychiatric: Patient is competent for consent with normal mood and affect Lymphatic: No axillary or cervical lymphadenopathy  MUSCULOSKELETAL: r. Foot-- 15mm open wound over r 2nd metatarsal   Assessment/Plan: open wound right foot Plan for Procedure(s): AMPUTATION DIGIT as needed to gain good closure  The risks benefits and  alternatives were discussed with the patient including but not limited to the risks of nonoperative treatment, versus surgical intervention including infection, bleeding, nerve injury, malunion, nonunion, hardware prominence, hardware failure, need for hardware removal, blood clots, cardiopulmonary complications, morbidity, mortality, among others, and they were willing to proceed.  Predicted outcome is good, although there will be at least a six to nine month expected recovery.  Gypsy Kellogg L, MD 09/09/2011 11:18 AM

## 2011-09-09 NOTE — Anesthesia Procedure Notes (Signed)
Procedure Name: MAC Date/Time: 09/09/2011 11:31 AM Performed by: Lular Letson D Pre-anesthesia Checklist: Patient identified, Emergency Drugs available, Suction available, Patient being monitored and Timeout performed Patient Re-evaluated:Patient Re-evaluated prior to inductionOxygen Delivery Method: Simple face mask

## 2011-09-14 ENCOUNTER — Encounter (HOSPITAL_BASED_OUTPATIENT_CLINIC_OR_DEPARTMENT_OTHER): Payer: Self-pay | Admitting: Orthopedic Surgery

## 2011-09-15 ENCOUNTER — Encounter (HOSPITAL_BASED_OUTPATIENT_CLINIC_OR_DEPARTMENT_OTHER): Payer: Self-pay

## 2011-10-04 ENCOUNTER — Encounter: Payer: Self-pay | Admitting: Internal Medicine

## 2011-10-04 ENCOUNTER — Ambulatory Visit (INDEPENDENT_AMBULATORY_CARE_PROVIDER_SITE_OTHER): Payer: Medicare Other | Admitting: Internal Medicine

## 2011-10-04 VITALS — BP 124/62 | HR 79 | Temp 97.6°F | Wt 147.0 lb

## 2011-10-04 DIAGNOSIS — R918 Other nonspecific abnormal finding of lung field: Secondary | ICD-10-CM

## 2011-10-04 DIAGNOSIS — E119 Type 2 diabetes mellitus without complications: Secondary | ICD-10-CM

## 2011-10-04 DIAGNOSIS — Z79899 Other long term (current) drug therapy: Secondary | ICD-10-CM

## 2011-10-04 DIAGNOSIS — E1149 Type 2 diabetes mellitus with other diabetic neurological complication: Secondary | ICD-10-CM

## 2011-10-04 DIAGNOSIS — R6889 Other general symptoms and signs: Secondary | ICD-10-CM

## 2011-10-04 DIAGNOSIS — Z899 Acquired absence of limb, unspecified: Secondary | ICD-10-CM

## 2011-10-04 DIAGNOSIS — E785 Hyperlipidemia, unspecified: Secondary | ICD-10-CM

## 2011-10-04 DIAGNOSIS — E1142 Type 2 diabetes mellitus with diabetic polyneuropathy: Secondary | ICD-10-CM

## 2011-10-04 DIAGNOSIS — R413 Other amnesia: Secondary | ICD-10-CM

## 2011-10-04 DIAGNOSIS — R9389 Abnormal findings on diagnostic imaging of other specified body structures: Secondary | ICD-10-CM

## 2011-10-04 LAB — BASIC METABOLIC PANEL
BUN: 18 mg/dL (ref 6–23)
CO2: 25 mEq/L (ref 19–32)
Calcium: 9.5 mg/dL (ref 8.4–10.5)
Chloride: 100 mEq/L (ref 96–112)
Creatinine, Ser: 1.1 mg/dL (ref 0.4–1.2)

## 2011-10-04 LAB — HEMOGLOBIN A1C: Hgb A1c MFr Bld: 6.5 % (ref 4.6–6.5)

## 2011-10-04 NOTE — Patient Instructions (Addendum)
Will notify you  of labs when available.  Get x ray when convenient.   No change in meds  At this time  Get an eye check.

## 2011-10-04 NOTE — Progress Notes (Signed)
Subjective:    Patient ID: Natasha West, female    DOB: 1927-08-01, 78 y.o.   MRN: [id number]  HPI Patient comes in today for follow up of  multiple medical problems.  Since last visit has had another wound on right foot and had partial foot amputation doing well but at risk. No recent fall of trauma by hx. Mood better on aricept.  DM : eating ok no recent eye check. husband checks sugars . No lows reported .  Bp good  Appettie ok. Living arrange ment the same NP check s on her monthly   Review of Systems No cp sob cough had ear clogged and was seen by np who helped with flushing and drops better  No bleeding  New joint problems . Doe  Uses walker.  Past history family history social history reviewed in the electronic medical record. Outpatient Encounter Prescriptions as of 10/04/2011  Medication Sig Dispense Refill  . Ascorbic Acid (VITAMIN C) 100 MG tablet Take 100 mg by mouth daily.        [name] aspirin 81 MG chewable tablet Chew 81 mg by mouth daily.        [name] atorvastatin (LIPITOR) 10 MG tablet Take 1 tablet (10 mg total) by mouth daily.  90 tablet  0  . calcium-vitamin D (OSCAL WITH D) 250-125 MG-UNIT per tablet Take 1 tablet by mouth daily.      [name] donepezil (ARICEPT) 10 MG tablet Take 1 tablet (10 mg total) by mouth at bedtime. 1 tab at bedtime.  90 tablet  3  . HYDROcodone-acetaminophen (VICODIN) 5-500 MG per tablet 1/2 po bid ? Provider      . metFORMIN (GLUCOPHAGE-XR) 500 MG 24 hr tablet TAKE 1 TABLET TWICE A DAY  180 tablet  2  . multivitamin (THERAGRAN) per tablet Take 1 tablet by mouth daily.        . Nutritional Supplements (JUICE PLUS FIBRE PO) Take by mouth.      . vitamin B-12 (CYANOCOBALAMIN) 100 MCG tablet Take 500 mcg by mouth daily. 1000 daily        Past history family history social history reviewed in the electronic medical record.     Objective:   Physical Exam BP 124/62  Pulse 79  Temp 97.6 F (36.4 C) (Oral)  Wt 147 lb (66.679 kg)  SpO2 98% Wt Readings  from Last 3 Encounters:  10/04/11 147 lb (66.679 kg)  09/09/11 142 lb (64.411 kg)  09/09/11 142 lb (64.411 kg)    WDWn in nad  Well  groomed.  Alert interactive with normal speech in nad  Neck: Supple without adenopathy or masses or bruits HEENT:  at  Eyes clear op no acute Change Ears tms intact . Chest:  Clear to A&P without wheezes rales or rhonchi CV:  S1-S2 no gallops or murmurs peripheral perfusion is normal extr right bandaged wrapped not removed  Left foot  Clear except redness at base of right great toenail without  Fluctuance or ulcer  Padding at peripheral joint.    Neuro  Not oriented to date or year   oriented to person and place . No obv motor deficits and  Today in [facility]  Has a roller walker at home.     Assessment & Plan:   DM  :Get eye exam. Vascular neuro complications with partial amputations    [facility] continuing to visit and according to pt and family is a great help .   Husband who has vision deficit  is checking her sugars no reported lows  Is only on metformin and has hx of stable renal function and no CHF so continue depending on labs. S/p amputation to fu with dr Luiz Blare  Left foot has some redness near toes with  Padding no ulcers  To see dr Luiz Blare soon they should check.  LIPIDS still on meds  Continue for now  No reported se at this time but  Consider stopping if any concern about this Respiratory  Stable and no sx  X ray  Changes  Uncertain if significant but will repeat  Obtained when in wound clinic / r atelectasis vs PNA  No current sx  Cognitive impairment.  Daughter feels brighter affect after meds and  Not depressed . Continue meds.Fall prevention and continued support from c spinks  GNNP.

## 2011-10-09 DIAGNOSIS — R9389 Abnormal findings on diagnostic imaging of other specified body structures: Secondary | ICD-10-CM | POA: Insufficient documentation

## 2011-10-09 DIAGNOSIS — Z899 Acquired absence of limb, unspecified: Secondary | ICD-10-CM | POA: Insufficient documentation

## 2011-10-10 ENCOUNTER — Encounter: Payer: Self-pay | Admitting: Family Medicine

## 2011-10-10 ENCOUNTER — Other Ambulatory Visit: Payer: Self-pay | Admitting: Family Medicine

## 2011-10-10 DIAGNOSIS — E1142 Type 2 diabetes mellitus with diabetic polyneuropathy: Secondary | ICD-10-CM

## 2011-10-10 DIAGNOSIS — R5383 Other fatigue: Secondary | ICD-10-CM

## 2011-10-10 DIAGNOSIS — E1149 Type 2 diabetes mellitus with other diabetic neurological complication: Secondary | ICD-10-CM

## 2011-10-10 DIAGNOSIS — E785 Hyperlipidemia, unspecified: Secondary | ICD-10-CM

## 2011-10-10 DIAGNOSIS — E119 Type 2 diabetes mellitus without complications: Secondary | ICD-10-CM

## 2011-10-18 ENCOUNTER — Telehealth: Payer: Self-pay | Admitting: Family Medicine

## 2011-10-18 NOTE — Telephone Encounter (Signed)
We received notification that this patient did not go to radiology and obtain the chest xray that was ordered 10/04/11.

## 2011-11-01 ENCOUNTER — Other Ambulatory Visit: Payer: Self-pay | Admitting: Internal Medicine

## 2011-11-01 MED ORDER — ATORVASTATIN CALCIUM 10 MG PO TABS: 10.0000 mg | ORAL_TABLET | Freq: Every day | ORAL | Status: DC

## 2011-11-25 ENCOUNTER — Other Ambulatory Visit: Payer: Self-pay | Admitting: Internal Medicine

## 2011-11-25 MED ORDER — METFORMIN HCL ER 500 MG PO TB24: 500.0000 mg | ORAL_TABLET | Freq: Two times a day (BID) | ORAL | Status: DC

## 2012-02-03 ENCOUNTER — Encounter: Payer: Self-pay | Admitting: Internal Medicine

## 2012-02-03 ENCOUNTER — Ambulatory Visit (INDEPENDENT_AMBULATORY_CARE_PROVIDER_SITE_OTHER): Payer: Medicare Other | Admitting: Internal Medicine

## 2012-02-03 VITALS — BP 124/70 | HR 85 | Temp 97.6°F

## 2012-02-03 DIAGNOSIS — R9389 Abnormal findings on diagnostic imaging of other specified body structures: Secondary | ICD-10-CM

## 2012-02-03 DIAGNOSIS — F1110 Opioid abuse, uncomplicated: Secondary | ICD-10-CM

## 2012-02-03 DIAGNOSIS — R918 Other nonspecific abnormal finding of lung field: Secondary | ICD-10-CM

## 2012-02-03 DIAGNOSIS — E785 Hyperlipidemia, unspecified: Secondary | ICD-10-CM

## 2012-02-03 DIAGNOSIS — E1142 Type 2 diabetes mellitus with diabetic polyneuropathy: Secondary | ICD-10-CM | POA: Insufficient documentation

## 2012-02-03 DIAGNOSIS — R5381 Other malaise: Secondary | ICD-10-CM

## 2012-02-03 DIAGNOSIS — R6889 Other general symptoms and signs: Secondary | ICD-10-CM

## 2012-02-03 DIAGNOSIS — R413 Other amnesia: Secondary | ICD-10-CM

## 2012-02-03 DIAGNOSIS — I10 Essential (primary) hypertension: Secondary | ICD-10-CM

## 2012-02-03 DIAGNOSIS — F1190 Opioid use, unspecified, uncomplicated: Secondary | ICD-10-CM

## 2012-02-03 DIAGNOSIS — Z899 Acquired absence of limb, unspecified: Secondary | ICD-10-CM

## 2012-02-03 DIAGNOSIS — E1149 Type 2 diabetes mellitus with other diabetic neurological complication: Secondary | ICD-10-CM

## 2012-02-03 DIAGNOSIS — R5383 Other fatigue: Secondary | ICD-10-CM

## 2012-02-03 DIAGNOSIS — E119 Type 2 diabetes mellitus without complications: Secondary | ICD-10-CM

## 2012-02-03 LAB — HEMOGLOBIN A1C: Hgb A1c MFr Bld: 6.8 % — ABNORMAL HIGH (ref 4.6–6.5)

## 2012-02-03 LAB — BASIC METABOLIC PANEL
BUN: 17 mg/dL (ref 6–23)
Calcium: 9.5 mg/dL (ref 8.4–10.5)
Creatinine, Ser: 0.8 mg/dL (ref 0.4–1.2)
GFR: 68.91 mL/min (ref >60.00)
Glucose, Bld: 91 mg/dL (ref 70–99)

## 2012-02-03 LAB — TSH: TSH: 0.93 u[IU]/mL (ref 0.35–5.50)

## 2012-02-03 LAB — LIPID PANEL: Total CHOL/HDL Ratio: 3

## 2012-02-03 MED ORDER — HYDROCODONE-ACETAMINOPHEN 5-325 MG PO TABS: 0.5000 | ORAL_TABLET | Freq: Two times a day (BID) | ORAL | Status: DC

## 2012-02-03 NOTE — Progress Notes (Signed)
Subjective:    Patient ID: Natasha West, female    DOB: 12/19/1927, 78 y.o.   MRN: [id number]  HPI Patient comes in today for follow up of  multiple medical problems.  She is here with her daughter today. The most pressing problem she states is that they need help to have me take over her prescription for pain medicine. She apparently has been on hydrocodone 5 500 for quite a while for various reasons probably beginning with her various surgeries pain for neuropathy. I have not prescribed this medication for her however the last one was given to her by Dr. Anne Hahn who evaluated her for memory loss. It was written in August and she prefers to not go back and have [place] take over the medication. They tried to wean her down on the medicine and one day she had shaking and severe discomfort and was felt that she was dependent on it. At this time she is taking a half twice a day. Not complaining of pain at this time. She may have been on Lyrica at some time daughter doesn't know.  No recent falling she remains a lot in the wheelchair her husband who is visually impaired wheels her round and is afraid of her falling so doesn't really d much. Physical therapy was attempted in the past but they usually send people away [facility] nurse has been coming out checking her foot on a very regular basis. Her last surgery was in the last few months Dr. Luiz Blare. Daughter states that someone checks her feet on a regular basis.  Denies chest pain shortness of breath or cough followup chest x-ray was never obtained see past notes.   Review of Systems Negative for fever or hearing vision problems no recent vision exam unusual bleeding bruising appetite is adequate. She is less anxious and she's been put on Aricept. She seems to be more pleasant.  Past history family history social history reviewed in the electronic medical record. Lives with her husband who is also diabetic but visually impaired. Daughters come and check on her  they take over her pill box and others to make sure that she is getting medicine as prescribed. See above    Objective:   Physical Exam  BP 124/70  Pulse 85  Temp 97.6 F (36.4 C) (Oral)  SpO2 98% Well-developed well-nourished in no acute distress well-groomed eye contact speech normal short answers. Memory test not given today. HEENT is grossly normal neck without bruising or masses chest clear to auscultation cardiac regular rhythm no gallops or murmurs are noted extremities or in her diabetic shoes no significant edema. Reviewed the record shows chest x-ray pending laboratory studies from the summer pending.      Assessment & Plan:  DM with complications  since she is here today have her labs rechecked. Has of her neuropathy disease and amputation is very important to have someone check her feet on a regular basis today we did not because patient did not want to and daughter reassured me that it had been checked.Ht controlled.  Pain medication ;narcotic use given by many other specialists request for me to prescribe from now on. Family is trying to wean her down but believe that she is dependent on it based on their history of trying to wean. If she is having pain it is not unreasonable to use his medication however she might get more benefit from something like Lyrica or Neurontin because of the kind of pain she house. At this  time we'll give 40 of the hydrocodone 08/323 to take a half twice a day wean as tolerated 2 refills to get her through to her next visit. Last prescription was written by Dr. Anne Hahn in August. They are out of medication now and are concerned that she will withdraw from the medicine. D  Dementia and mood stable on Aricept problematic at times PHN as above Abnormal chest x-ray suggest get a followup to be safe when convenient. Discussed getting in this at [facility] the order is still in the electronic health record cue pending.  Total visit > 50% spent counseling  and coordinating care  Review

## 2012-02-03 NOTE — Patient Instructions (Addendum)
Please go to the lab and get your  Blood tests today. Caution with pain medication  1/2 or less twice a day for now. Wean as tolerated .  If nerve pain sometimes lyrica or neuron tin helps but can cause drowsiness ROV in 3-4 monhts or as needed. Make sure feet are checked frequently.

## 2012-02-08 ENCOUNTER — Other Ambulatory Visit: Payer: Self-pay | Admitting: Internal Medicine

## 2012-04-05 ENCOUNTER — Ambulatory Visit: Payer: Medicare Other | Admitting: Internal Medicine

## 2012-04-19 ENCOUNTER — Ambulatory Visit (INDEPENDENT_AMBULATORY_CARE_PROVIDER_SITE_OTHER): Payer: Medicare Other | Admitting: Internal Medicine

## 2012-04-19 ENCOUNTER — Encounter: Payer: Self-pay | Admitting: Internal Medicine

## 2012-04-19 VITALS — BP 140/80 | HR 94 | Temp 97.5°F | Wt 156.0 lb

## 2012-04-19 DIAGNOSIS — R413 Other amnesia: Secondary | ICD-10-CM

## 2012-04-19 DIAGNOSIS — R262 Difficulty in walking, not elsewhere classified: Secondary | ICD-10-CM

## 2012-04-19 DIAGNOSIS — E785 Hyperlipidemia, unspecified: Secondary | ICD-10-CM

## 2012-04-19 DIAGNOSIS — E1149 Type 2 diabetes mellitus with other diabetic neurological complication: Secondary | ICD-10-CM

## 2012-04-19 DIAGNOSIS — G609 Hereditary and idiopathic neuropathy, unspecified: Secondary | ICD-10-CM

## 2012-04-19 DIAGNOSIS — I10 Essential (primary) hypertension: Secondary | ICD-10-CM

## 2012-04-19 DIAGNOSIS — D649 Anemia, unspecified: Secondary | ICD-10-CM

## 2012-04-19 LAB — CBC WITH DIFFERENTIAL/PLATELET
Eosinophils Relative: 1.9 % (ref 0.0–5.0)
HCT: 40.6 % (ref 36.0–46.0)
Hemoglobin: 13.6 g/dL (ref 12.0–15.0)
Lymphocytes Relative: 22.7 % (ref 12.0–46.0)
Lymphs Abs: 1.7 10*3/uL (ref 0.7–4.0)
Monocytes Relative: 5.9 % (ref 3.0–12.0)
Neutro Abs: 5.2 10*3/uL (ref 1.4–7.7)
Platelets: 261 10*3/uL (ref 150.0–400.0)
WBC: 7.5 10*3/uL (ref 4.5–10.5)

## 2012-04-19 LAB — BASIC METABOLIC PANEL
BUN: 14 mg/dL (ref 6–23)
Calcium: 9.6 mg/dL (ref 8.4–10.5)
Chloride: 101 mEq/L (ref 96–112)
GFR: 67.03 mL/min (ref >60.00)
Glucose, Bld: 108 mg/dL — ABNORMAL HIGH (ref 70–99)
Potassium: 3.8 mEq/L (ref 3.5–5.1)
Sodium: 137 mEq/L (ref 135–145)

## 2012-04-19 LAB — TSH: TSH: 1.03 u[IU]/mL (ref 0.35–5.50)

## 2012-04-19 LAB — HEPATIC FUNCTION PANEL
ALT: 21 U/L (ref 0–35)
AST: 23 U/L (ref 0–37)
Alkaline Phosphatase: 57 U/L (ref 39–117)
Total Bilirubin: 0.8 mg/dL (ref 0.3–1.2)

## 2012-04-19 LAB — LIPID PANEL: Total CHOL/HDL Ratio: 3

## 2012-04-19 LAB — CK: Total CK: 40 U/L (ref 7–177)

## 2012-04-19 NOTE — Patient Instructions (Addendum)
Will notify you  of labs when available.  Try off of the atorvastatin   For about 2-3 weeks and see if the leg problems get better . Ir they do then restart and see if returns .  If this does we may change the cholesterol medication .  Sometimes   Lipid medication can cause muscle problems  And achiness and weak feeling .    Will plan fu in 6 months otherwise .

## 2012-04-19 NOTE — Progress Notes (Signed)
Chief Complaint  Patient presents with  . Follow-up    Insurance called and spoke to the husband.  Informed him that the pt has not had a lipid panel.  He would like this done today.    HPI: Patient comes in today for follow up of  multiple medical problems.  actutally here with husband as was told to get a lipid test done by phone call to the residence . By insurance co and that is the main reason they are here .  Since last visit no major change or fall however  Over the last few weeks has had less stamina in her legs  Walks with a walker but gets shaky legs after small amount of activity. No change in meds  No falling   Foot checked daily by attendant care person.   No change in meds ?   Taking atorvastatin. No change in vision hearing breathing is olk and no bleeding. ROS: See pertinent positives and negatives per HPI.  Past Medical History  Diagnosis Date  . Diabetes mellitus   . Hyperlipidemia   . Hypertension   . Peripheral neuropathy     partial amputation rt foot  . Memory difficulty   . Foot infection     with sepsis  and amputation toes right    Family History  Problem Relation Age of Onset  . Alzheimer's disease Mother   . Other Father     cva endarterectomy  . Diabetes type II Daughter     History   Social History  . Marital Status: Married    Spouse Name: N/A    Number of Children: N/A  . Years of Education: N/A   Social History Main Topics  . Smoking status: Former Smoker    Quit date: 09/07/1991  . Smokeless tobacco: Never Used  . Alcohol Use: No  . Drug Use: No  . Sexually Active: None   Other Topics Concern  . None   Social History Narrative   Married Husband has vision problem and recurrent falls[place] [id number] back to [place] after [employer] is prepared for them. 2 daughters checking on them1 help her who comes in to help bathe and dress patient    Outpatient  Encounter Prescriptions as of 04/19/2012  Medication Sig Dispense Refill  . Ascorbic Acid (VITAMIN C) 100 MG tablet Take 100 mg by mouth daily.        [name] aspirin 81 MG chewable tablet Chew 81 mg by mouth daily.        [name] atorvastatin (LIPITOR) 10 MG tablet Take 1 tablet (10 mg total) by mouth daily.  90 tablet  3  . calcium-vitamin D (OSCAL WITH D) 250-125 MG-UNIT per tablet Take 1 tablet by mouth daily.      [name] donepezil (ARICEPT) 10 MG tablet Take 1 tablet (10 mg total) by mouth at bedtime. 1 tab at bedtime.  90 tablet  3  . HYDROcodone-acetaminophen (NORCO/VICODIN) 5-325 MG per tablet Take 0.5 tablets by mouth 2 (two) times daily. If needed for pain or as directed  Can use 1/4 tab  40 tablet  2  . metFORMIN (GLUCOPHAGE-XR) 500 MG 24 hr tablet Take 1 tablet (500 mg total) by mouth 2 (two) times daily.  180 tablet  1  . multivitamin (THERAGRAN) per tablet Take 1 tablet by mouth daily.        . Nutritional Supplements (JUICE PLUS FIBRE PO) Take by mouth.      [name]  vitamin B-12 (CYANOCOBALAMIN) 100 MCG tablet Take 500 mcg by mouth daily. 1000 daily         EXAM:  BP 140/80  Pulse 94  Temp 97.5 F (36.4 C) (Oral)  Wt 156 lb (70.761 kg)  SpO2 97%  There is no height on file to calculate BMI.  GENERAL: vitals reviewed and listed above, alert, conversant , appears well hydrated and in no acute distress  HEENT: atraumatic, conjunctiva  clear, no obvious abnormalities on inspection of external nose and ears OP : no lesion edema or exudate   NECK: no obvious masses on inspection palpation   LUNGS: clear to auscultation bilaterally, no wheezes, rales or rhonchi, good air movement  CV: HRRR, no clubbing cyanosis or  peripheral edema nl cap refill   MS: moves all extremities left foot intact no ulcers  Band aid over great toe at pressure point  Thickened nails   Neuro  non focal no fasciulations  Moves all extremities   ASSESSMENT AND PLAN:  Discussed the following assessment and plan:  1.  HYPERLIPIDEMIA  Basic metabolic panel, Lipid panel, TSH, CBC with Differential, Hepatic function panel, CK, Hemoglobin A1c   insuance called pt  to get test but has been done in october will repeat because of concern about insurance not otherwise necessary .odd situation.   2. DIABETES MELLITUS, TYPE II, WITH NEUROLOGICAL COMPLICATIONS  Basic metabolic panel, Lipid panel, TSH, CBC with Differential, Hepatic function panel, CK, Hemoglobin A1c  3. PERIPHERAL NEUROPATHY  Basic metabolic panel, Lipid panel, TSH, CBC with Differential, Hepatic function panel, CK, Hemoglobin A1c  4. Unspecified anemia  Basic metabolic panel, Lipid panel, TSH, CBC with Differential, Hepatic function panel, CK, Hemoglobin A1c  5. HYPERTENSION  Basic metabolic panel, Lipid panel, TSH, CBC with Differential, Hepatic function panel, CK, Hemoglobin A1c  6. Walking troubles     co legs getting tired   uncertain cause  r/o statin myopathy by trial off   7. Memory difficulties     consider other causes if progressive . -Patient advised to return or notify health care team  immediately if symptoms worsen or persist or new concerns arise.  Patient Instructions  Will notify you  of labs when available.  Try off of the atorvastatin   For about 2-3 weeks and see if the leg problems get better . Ir they do then restart and see if returns .  If this does we may change the cholesterol medication .  Sometimes   Lipid medication can cause muscle problems  And achiness and weak feeling .    Will plan fu in 6 months otherwise .    Neta Mends. Kaelah Hayashi M.D. After pt left  Uncertain if still taking narcotic med that family was trying to wean for her. Daughter  Doesn't have internet so doesn't have My chart

## 2012-04-20 ENCOUNTER — Other Ambulatory Visit: Payer: Self-pay | Admitting: Internal Medicine

## 2012-04-20 MED ORDER — METFORMIN HCL ER 500 MG PO TB24: 500.0000 mg | ORAL_TABLET | Freq: Two times a day (BID) | ORAL | Status: DC

## 2012-05-26 ENCOUNTER — Other Ambulatory Visit: Payer: Self-pay | Admitting: Internal Medicine

## 2012-05-29 NOTE — Telephone Encounter (Signed)
Last seen on 04/19/12.  Last filled on 02/03/12 340 with 2 additional refills.  Has an upcoming appointment on 06/11/12.  Please advise.  Thanks!!

## 2012-05-31 NOTE — Telephone Encounter (Signed)
I would lke her to wean this medication   As much as possible.  Can refill 30  # no refills and will discuss at [facility].

## 2012-06-08 ENCOUNTER — Ambulatory Visit: Payer: Medicare Other | Admitting: Internal Medicine

## 2012-06-11 ENCOUNTER — Ambulatory Visit: Payer: Medicare Other | Admitting: Internal Medicine

## 2012-06-11 ENCOUNTER — Telehealth: Payer: Self-pay | Admitting: Internal Medicine

## 2012-06-11 MED ORDER — DONEPEZIL HCL 10 MG PO TABS: 10.0000 mg | ORAL_TABLET | Freq: Every day | ORAL | Status: DC

## 2012-06-11 NOTE — Telephone Encounter (Signed)
Sent by e-scribe. 

## 2012-06-11 NOTE — Telephone Encounter (Signed)
Pt needs refill of donepezil (ARICEPT) 10 MG tablet.  Please send to [facility]   (From now on) Pt had to cancel appt this am due to weather, she is in a wheelchair Daughter will call back later this am after she checks her schedule.

## 2012-06-13 ENCOUNTER — Telehealth: Payer: Self-pay | Admitting: Internal Medicine

## 2012-06-13 NOTE — Telephone Encounter (Signed)
Pt had to miss Mon due to our closing at 3pm. No appt except "Same Day". Daughter states Thurs or Natasha West are best, 11 am or after. Reason for appt: Pt will needs refill on meds. Pls advise.

## 2012-06-14 NOTE — Telephone Encounter (Signed)
Tomorrow at 145 ( 30 min wcc slot)  If we  dont close for ice again. Or next  Friday 930 945 slots   What  meds  Do we need to refill  Before she comes in?  ( how often  Is she taking  the narcotics    Should be weaning dose)

## 2012-06-18 ENCOUNTER — Encounter: Payer: Self-pay | Admitting: Internal Medicine

## 2012-06-18 ENCOUNTER — Ambulatory Visit (INDEPENDENT_AMBULATORY_CARE_PROVIDER_SITE_OTHER): Payer: Medicare Other | Admitting: Internal Medicine

## 2012-06-18 VITALS — BP 118/66 | HR 85 | Temp 97.8°F

## 2012-06-18 DIAGNOSIS — R413 Other amnesia: Secondary | ICD-10-CM

## 2012-06-18 DIAGNOSIS — Z79899 Other long term (current) drug therapy: Secondary | ICD-10-CM

## 2012-06-18 DIAGNOSIS — E785 Hyperlipidemia, unspecified: Secondary | ICD-10-CM

## 2012-06-18 DIAGNOSIS — I10 Essential (primary) hypertension: Secondary | ICD-10-CM

## 2012-06-18 DIAGNOSIS — E1149 Type 2 diabetes mellitus with other diabetic neurological complication: Secondary | ICD-10-CM

## 2012-06-18 MED ORDER — HYDROCODONE-ACETAMINOPHEN 5-325 MG PO TABS: ORAL_TABLET | ORAL | Status: DC

## 2012-06-18 NOTE — Patient Instructions (Signed)
Continue monitoring foot  .       Pain med  As needed with caveat of possible side effects.  No other change in medication.   OV   In the summer July   Or so  Lab before or at visit

## 2012-06-18 NOTE — Progress Notes (Signed)
Chief Complaint  Patient presents with  . Follow-up  . Diabetes    HPI: Patient comes in with daughter today. For followup. Since her last visit there've been no major changes in her health status and the patient herself states she's doing well. Fallen a few times so has seen a PT .    With this things have been better at home her husband tries to help her transfer but she does better than he thinks she doesn't get up and around as much as in the past. Pain medication:  Family history to wean but she seems to do best on a half a pill twice a day of 5/325 hydrocodone. It makes her sleep better at night she gets the medicine in the morning just as a routine probably for practical reasons and sometimes for pain. 1/ 2 in am and 1/2 at night.  Eating ok doesn't eat a lot Diabetes with neuropathy not really checking blood sugars recently. No symptoms.   Feet doing ok .  Checking on a regular basis by an attendant who the family trusts to check her feet continually and no problems have been noted. ROS: See pertinent positives and negatives per HPI. No chest pain shortness of breath unusual bleeding or change in vision.  Past Medical History  Diagnosis Date  . Diabetes mellitus   . Hyperlipidemia   . Hypertension   . Peripheral neuropathy     partial amputation rt foot  . Memory difficulty   . Foot infection     with sepsis  and amputation toes right  . Open wound of right foot 09/09/2011  . LOWER LIMB AMPUTATION, OTHER TOE 05/07/2008    Qualifier: Diagnosis of  By: Fabian Sharp MD, Neta Mends   . Lower limb amputation, great toe 06/30/2008    Qualifier: Diagnosis of  By: Fabian Sharp MD, Neta Mends     Family History  Problem Relation Age of Onset  . Alzheimer's disease Mother   . Other Father     cva endarterectomy  . Diabetes type II Daughter     History   Social History  . Marital Status: Married    Spouse Name: N/A    Number of Children: N/A  . Years of Education: N/A   Social History Main  Topics  . Smoking status: Former Smoker    Quit date: 09/07/1991  . Smokeless tobacco: Never Used  . Alcohol Use: No  . Drug Use: No  . Sexually Active: None   Other Topics Concern  . None   Social History Narrative   Married    Husband has vision problem and recurrent falls   Moved to retirement place   [place] 2010 closer to family   G4P3   Currently back to [place] after [employer] is prepared for them. 2 daughters checking on them   1 help her who comes in to help bathe and dress patient    Outpatient Encounter Prescriptions as of 06/18/2012  Medication Sig Dispense Refill  . Ascorbic Acid (VITAMIN C) 100 MG tablet Take 100 mg by mouth daily.        [name] aspirin 81 MG chewable tablet Chew 81 mg by mouth daily.        [name] atorvastatin (LIPITOR) 10 MG tablet Take 1 tablet (10 mg total) by mouth daily.  90 tablet  3  . calcium-vitamin D (OSCAL WITH D) 250-125 MG-UNIT per tablet Take 1 tablet by mouth daily.      [name]  donepezil (ARICEPT) 10 MG tablet Take 1 tablet (10 mg total) by mouth at bedtime. 1 tab at bedtime.  90 tablet  1  . HYDROcodone-acetaminophen (NORCO/VICODIN) 5-325 MG per tablet TAKE ONE-HALF TABLET BY MOUTH TWICE DAILY AS NEEDED FOR PAIN. MAY TAKE ONE-FOURTH TABLET  40 tablet  1  . metFORMIN (GLUCOPHAGE-XR) 500 MG 24 hr tablet Take 1 tablet (500 mg total) by mouth 2 (two) times daily.  180 tablet  1  . multivitamin (THERAGRAN) per tablet Take 1 tablet by mouth daily.        . Nutritional Supplements (JUICE PLUS FIBRE PO) Take by mouth.      . vitamin B-12 (CYANOCOBALAMIN) 100 MCG tablet Take 500 mcg by mouth daily. 1000 daily       . [DISCONTINUED] HYDROcodone-acetaminophen (NORCO/VICODIN) 5-325 MG per tablet TAKE ONE-HALF TABLET BY MOUTH TWICE DAILY AS NEEDED FOR PAIN. MAY TAKE ONE-FOURTH TABLET  40 tablet  0   No facility-administered encounter medications on file as of 06/18/2012.    EXAM:  BP 118/66  Pulse 85  Temp(Src) 97.8 F (36.6  C) (Oral)  SpO2 97%  Body mass index is 0.00 kg/(m^2).  GENERAL: vitals reviewed and listed above, alert, , appears well hydrated and in no acute distress  HEENT: atraumatic, conjunctiva  clear, no obvious abnormalities on inspection of external nose and ears OP : no lesion edema or exudate   NECK: no obvious masses on inspection palpation   LUNGS: clear to auscultation bilaterally, no wheezes, rales or rhonchi, good air movement  CV: HRRR, no clubbing cyanosis or   nl cap refill   MS: moves all extremities she is sitting in a wheelchair but moves all extremities. Her feet are in diabetic shoes. A were not examined today.  PSYCH: pleasant and cooperative, no obvious depression or anxiety Lab Results  Component Value Date   WBC 7.5 04/19/2012   HGB 13.6 04/19/2012   HCT 40.6 04/19/2012   PLT 261.0 04/19/2012   GLUCOSE 108* 04/19/2012   CHOL 145 04/19/2012   TRIG 152.0* 04/19/2012   HDL 53.60 04/19/2012   LDLDIRECT 77.8 09/20/2006   LDLCALC 61 04/19/2012   ALT 21 04/19/2012   AST 23 04/19/2012   NA 137 04/19/2012   K 3.8 04/19/2012   CL 101 04/19/2012   CREATININE 0.9 04/19/2012   BUN 14 04/19/2012   CO2 26 04/19/2012   TSH 1.03 04/19/2012   HGBA1C 7.0* 04/19/2012   MICROALBUR 1.9 05/19/2011     ASSESSMENT AND PLAN:  Discussed the following assessment and plan:  DIABETES MELLITUS, TYPE II, WITH NEUROLOGICAL COMPLICATIONS  HYPERLIPIDEMIA  HYPERTENSION  Memory difficulties  High risk medication use - Benefit more than risk at this time at low dose. Discussed potential side effects we'll continue at half twice a day at this time for she is doing well. Discussed importance of daily chronic foot checks which the family now feels is adequate and reliable. Also risk of falling memory difficulties with some medications although at this time okay to stay on a half of Vicodin twice a day after discussion. We'll plan laboratory studies in the summer with her visit or before the visit. No change in other  medications. Prescription for her Vicodin given today. -Patient advised to return or notify health care team  if symptoms worsen or persist or new concerns arise.  Patient Instructions  Continue monitoring foot  .       Pain med  As needed with caveat of possible side  effects.  No other change in medication.   OV   In the summer July   Or so  Lab before or at visit    Plan hg a1c and bmp.  Neta Mends. Panosh M.D.

## 2012-07-26 ENCOUNTER — Encounter (HOSPITAL_BASED_OUTPATIENT_CLINIC_OR_DEPARTMENT_OTHER): Payer: Medicare Other | Attending: Internal Medicine

## 2012-07-26 DIAGNOSIS — E785 Hyperlipidemia, unspecified: Secondary | ICD-10-CM | POA: Insufficient documentation

## 2012-07-26 DIAGNOSIS — Z96659 Presence of unspecified artificial knee joint: Secondary | ICD-10-CM | POA: Insufficient documentation

## 2012-07-26 DIAGNOSIS — E1149 Type 2 diabetes mellitus with other diabetic neurological complication: Secondary | ICD-10-CM | POA: Insufficient documentation

## 2012-07-26 DIAGNOSIS — L97509 Non-pressure chronic ulcer of other part of unspecified foot with unspecified severity: Secondary | ICD-10-CM | POA: Insufficient documentation

## 2012-07-26 DIAGNOSIS — Z79899 Other long term (current) drug therapy: Secondary | ICD-10-CM | POA: Insufficient documentation

## 2012-07-26 DIAGNOSIS — M2060 Acquired deformities of toe(s), unspecified, unspecified foot: Secondary | ICD-10-CM | POA: Insufficient documentation

## 2012-07-26 DIAGNOSIS — I10 Essential (primary) hypertension: Secondary | ICD-10-CM | POA: Insufficient documentation

## 2012-07-26 DIAGNOSIS — E1169 Type 2 diabetes mellitus with other specified complication: Secondary | ICD-10-CM | POA: Insufficient documentation

## 2012-07-26 DIAGNOSIS — E1142 Type 2 diabetes mellitus with diabetic polyneuropathy: Secondary | ICD-10-CM | POA: Insufficient documentation

## 2012-07-26 DIAGNOSIS — S98919A Complete traumatic amputation of unspecified foot, level unspecified, initial encounter: Secondary | ICD-10-CM | POA: Insufficient documentation

## 2012-07-26 DIAGNOSIS — F0390 Unspecified dementia without behavioral disturbance: Secondary | ICD-10-CM | POA: Insufficient documentation

## 2012-07-26 NOTE — Progress Notes (Signed)
[facility]  NAME:  Natasha West, BUREAU NO.:  [id number]  MEDICAL RECORD NO.:  [id number]      DATE OF BIRTH:  07/17/1927  PHYSICIAN:  Maxwell Caul, M.D. VISIT DATE:  07/26/2012                                  OFFICE VISIT   HISTORY:  This is an 79 year old woman who arrives for review of a wound on her left great toe.  She was previously seen here in 2013, in April for wound on her right great toe.  She had had previous toe amputations, and actually has gone on to have a right transmetatarsal amputation.  In any case, the wound on her left great toe occurs in the setting of a hammertoe on that side, and also diabetic insensate neuropathy.  The wound has been therefore, definitely less than a week, but was 1st noticed on Monday of this week.  She is followed by [facility] to arrange this appointment.  PAST MEDICAL HISTORY:  Diabetic neuropathy, hyperlipidemia, dementia, and hypertension.  PAST SURGICAL HISTORY:  Knee replacement and a right transmetatarsal amputation.  MEDICATION LIST:  Reviewed.  She is on metformin 500 b.i.d. for diabetes.  She was started on Keflex apparently yesterday for concerns about concomitant cellulitis.  PHYSICAL EXAMINATION:  VITAL SIGNS:  Temperature is 97.6, pulse 91, respirations 18, blood pressure 147/75.  CBG today was 130.  The patient does not look unwell.  Peripheral vascular assessment shows an ABI of 1.03 on the left.  The area was over the plantar.  The hammer deformity of her left great toe over the PIP joint measuring 0.7 x 0.7 x 0.1.  I removed some surrounding nonviable skin.  The area of concern here is a small area that shows some depth.  This may actually be down to the joint surface. I did not debride this further.  There is erythema around this extending to the dorsal surface base of the left great toe.  IMPRESSION:  Wagner's grade II diabetic foot ulcer, as described.   This is in the setting of a deformity of the toe, which is chronic.  She has no arterial issues, but it does have insensate diabetic neuropathy.  I removed redundant skin, but did not debride the surface of this any further.  I did not change the Keflex, which had already been prescribed, and I did not see anything that would be worth culturing. We dressed this with silver alginate Kerlix and a toe sock.  It can be changed once before her arrival here next week.  She already has a healing sandal, which I think was provided previously.  Her next appointment will be in 1 week's time.          ______________________________ Maxwell Caul, M.D.     MGR/MEDQ  D:  07/26/2012  T:  07/26/2012  Job:  [id number]

## 2012-08-09 ENCOUNTER — Encounter (HOSPITAL_BASED_OUTPATIENT_CLINIC_OR_DEPARTMENT_OTHER): Payer: Medicare Other | Attending: Internal Medicine

## 2012-08-09 DIAGNOSIS — L84 Corns and callosities: Secondary | ICD-10-CM | POA: Insufficient documentation

## 2012-08-09 DIAGNOSIS — E1169 Type 2 diabetes mellitus with other specified complication: Secondary | ICD-10-CM | POA: Insufficient documentation

## 2012-08-09 DIAGNOSIS — L97509 Non-pressure chronic ulcer of other part of unspecified foot with unspecified severity: Secondary | ICD-10-CM | POA: Insufficient documentation

## 2012-09-29 ENCOUNTER — Other Ambulatory Visit: Payer: Self-pay | Admitting: Internal Medicine

## 2012-10-22 ENCOUNTER — Ambulatory Visit (INDEPENDENT_AMBULATORY_CARE_PROVIDER_SITE_OTHER): Payer: Medicare Other | Admitting: Internal Medicine

## 2012-10-22 ENCOUNTER — Encounter: Payer: Self-pay | Admitting: Internal Medicine

## 2012-10-22 VITALS — BP 138/70 | HR 90 | Temp 97.4°F | Wt 160.0 lb

## 2012-10-22 DIAGNOSIS — R6889 Other general symptoms and signs: Secondary | ICD-10-CM

## 2012-10-22 DIAGNOSIS — E1149 Type 2 diabetes mellitus with other diabetic neurological complication: Secondary | ICD-10-CM

## 2012-10-22 DIAGNOSIS — I10 Essential (primary) hypertension: Secondary | ICD-10-CM

## 2012-10-22 DIAGNOSIS — G609 Hereditary and idiopathic neuropathy, unspecified: Secondary | ICD-10-CM

## 2012-10-22 DIAGNOSIS — R413 Other amnesia: Secondary | ICD-10-CM

## 2012-10-22 DIAGNOSIS — R52 Pain, unspecified: Secondary | ICD-10-CM

## 2012-10-22 DIAGNOSIS — Z899 Acquired absence of limb, unspecified: Secondary | ICD-10-CM

## 2012-10-22 DIAGNOSIS — E1142 Type 2 diabetes mellitus with diabetic polyneuropathy: Secondary | ICD-10-CM

## 2012-10-22 LAB — HEMOGLOBIN A1C: Hgb A1c MFr Bld: 7.4 % — ABNORMAL HIGH (ref 4.6–6.5)

## 2012-10-22 LAB — BASIC METABOLIC PANEL
BUN: 14 mg/dL (ref 6–23)
Chloride: 103 mEq/L (ref 96–112)
Creatinine, Ser: 1.1 mg/dL (ref 0.4–1.2)
Glucose, Bld: 132 mg/dL — ABNORMAL HIGH (ref 70–99)
Potassium: 4.5 mEq/L (ref 3.5–5.1)

## 2012-10-22 MED ORDER — HYDROCODONE-ACETAMINOPHEN 5-325 MG PO TABS: ORAL_TABLET | ORAL | Status: DC

## 2012-10-22 NOTE — Patient Instructions (Signed)
Agree fu with Dr Luiz Blare.   Will notify you  of labs when available. Blood pressure   On repeat is better.  Pain med as needed. ROV in 6 months if doing well.

## 2012-10-22 NOTE — Progress Notes (Signed)
Chief Complaint  Patient presents with  . Follow-up    Is fasting incase of lab work.    HPI: Patient comes in today for follow up of  multiple medical problems. Here with daughter.  DM neuropathy Now having problems with other foot. On intensive intervention has hammertoes lrft  that got an ulcer on it  Was on meds  Better.  1 months ago.    On antibiotic for foot. Keflex and then Septra no side effects reported  Seeing dr Luiz Blare  this week Thursday   ? Surgery or wait until heals.  Daughter said  it is healing  Memory about the same on Aricept  Mobility pretty much is in the wheelchair will stand up with a walker but not really ambulate. Taking pain pill a half to quarter twice a day no falling  Bp : Was up to 1 point with a home nurse pain but then better on recheck  ROS: See pertinent positives and negatives per HPI. Living with husband  Vision problems but no dementia  meds sorted out byu daughter at this time  Past Medical History  Diagnosis Date  . Diabetes mellitus   . Hyperlipidemia   . Hypertension   . Peripheral neuropathy     partial amputation rt foot  . Memory difficulty   . Foot infection     with sepsis  and amputation toes right  . Open wound of right foot 09/09/2011  . LOWER LIMB AMPUTATION, OTHER TOE 05/07/2008    Qualifier: Diagnosis of  By: Fabian Sharp MD, Neta Mends   . Lower limb amputation, great toe 06/30/2008    Qualifier: Diagnosis of  By: Fabian Sharp MD, Neta Mends     Family History  Problem Relation Age of Onset  . Alzheimer's disease Mother   . Other Father     cva endarterectomy  . Diabetes type II Daughter     History   Social History  . Marital Status: Married    Spouse Name: N/A    Number of Children: N/A  . Years of Education: N/A   Social History Main Topics  . Smoking status: Former Smoker    Quit date: 09/07/1991  . Smokeless tobacco: Never Used  . Alcohol Use: No  . Drug Use: No  . Sexually Active: None   Other Topics Concern  .  None   Social History Narrative   Married    Husband has vision problem and recurrent falls   Moved to retirement place   [place] 2010 closer to family   G4P3   Currently back to [place] after [employer] is prepared for them. 2 daughters checking on them   1 help her who comes in to help bathe and dress patient    Outpatient Encounter Prescriptions as of 10/22/2012  Medication Sig Dispense Refill  . Ascorbic Acid (VITAMIN C) 100 MG tablet Take 100 mg by mouth daily.        [name] aspirin 81 MG chewable tablet Chew 81 mg by mouth daily.        [name] atorvastatin (LIPITOR) 10 MG tablet Take 1 tablet (10 mg total) by mouth daily.  90 tablet  3  . calcium-vitamin D (OSCAL WITH D) 250-125 MG-UNIT per tablet Take 1 tablet by mouth daily.      [name] donepezil (ARICEPT) 10 MG tablet Take 1 tablet (10 mg total) by mouth at bedtime. 1 tab at bedtime.  90 tablet  1  .  HYDROcodone-acetaminophen (NORCO/VICODIN) 5-325 MG per tablet TAKE ONE-HALF TABLET BY MOUTH TWICE DAILY AS NEEDED FOR PAIN. MAY TAKE ONE-FOURTH TABLET  60 tablet  2  . metFORMIN (GLUCOPHAGE-XR) 500 MG 24 hr tablet Take 1 tablet (500 mg total) by mouth 2 (two) times daily.  180 tablet  1  . multivitamin (THERAGRAN) per tablet Take 1 tablet by mouth daily.        . Nutritional Supplements (JUICE PLUS FIBRE PO) Take by mouth.      . sulfamethoxazole-trimethoprim (BACTRIM DS) 800-160 MG per tablet       . vitamin B-12 (CYANOCOBALAMIN) 100 MCG tablet Take 500 mcg by mouth daily. 1000 daily       . [DISCONTINUED] HYDROcodone-acetaminophen (NORCO/VICODIN) 5-325 MG per tablet TAKE ONE-HALF TABLET BY MOUTH TWICE DAILY AS NEEDED FOR PAIN. MAY TAKE ONE-FOURTH TABLET  40 tablet  0   No facility-administered encounter medications on file as of 10/22/2012.    EXAM:  BP 138/70  Pulse 90  Temp(Src) 97.4 F (36.3 C) (Oral)  Wt 160 lb (72.576 kg)  BMI 25.84 kg/m2  SpO2 94%  Body mass index is 25.84 kg/(m^2).  GENERAL:  vitals reviewed and listed above, alert, , appears well hydrated and in no acute distress in [facility] well groomed and conversant   HEENT: atraumatic, conjunctiva  clear, no obvious abnormalities on inspection of external nose and ears OP : no lesion edema or exudate   NECK: no obvious masses on inspection palpation  No jvd or adenopathy   LUNGS: clear to auscultation bilaterally, no wheezes, rales or rhonchi, good air movement  CV: HRRR, no clubbing cyanosis or  MS: moves all extremities   Left foot in op shoe and dressed and swrapped   Right foot in diabeteic shoe   PSYCH: pleasant and cooperative,  Lab Results  Component Value Date   WBC 7.5 04/19/2012   HGB 13.6 04/19/2012   HCT 40.6 04/19/2012   PLT 261.0 04/19/2012   GLUCOSE 108* 04/19/2012   CHOL 145 04/19/2012   TRIG 152.0* 04/19/2012   HDL 53.60 04/19/2012   LDLDIRECT 77.8 09/20/2006   LDLCALC 61 04/19/2012   ALT 21 04/19/2012   AST 23 04/19/2012   NA 137 04/19/2012   K 3.8 04/19/2012   CL 101 04/19/2012   CREATININE 0.9 04/19/2012   BUN 14 04/19/2012   CO2 26 04/19/2012   TSH 1.03 04/19/2012   HGBA1C 7.0* 04/19/2012   MICROALBUR 1.9 05/19/2011    ASSESSMENT AND PLAN:  Discussed the following assessment and plan:  DIABETES MELLITUS, TYPE II, WITH NEUROLOGICAL COMPLICATIONS - Plan: sulfamethoxazole-trimethoprim (BACTRIM DS) 800-160 MG per tablet, HYDROcodone-acetaminophen (NORCO/VICODIN) 5-325 MG per tablet, Basic metabolic panel, Hemoglobin A1c  Memory difficulties - Plan: sulfamethoxazole-trimethoprim (BACTRIM DS) 800-160 MG per tablet, HYDROcodone-acetaminophen (NORCO/VICODIN) 5-325 MG per tablet, Basic metabolic panel, Hemoglobin A1c  PERIPHERAL NEUROPATHY - Plan: sulfamethoxazole-trimethoprim (BACTRIM DS) 800-160 MG per tablet, HYDROcodone-acetaminophen (NORCO/VICODIN) 5-325 MG per tablet, Basic metabolic panel, Hemoglobin A1c  Polyneuropathy in diabetes(357.2)  Hx of amputation multiple toes and forefoot .  HYPERTENSION  Pain - seems controlled  byt low dose narcotic tried to wean and pt detereiorated   -Patient advised to return or notify health care team  if symptoms worsen or persist or new concerns arise.  Patient Instructions  Agree fu with Dr Luiz Blare.   Will notify you  of labs when available. Blood pressure   On repeat is better.  Pain med as needed. ROV in 6 months if doing well.  Standley Brooking. Rhetta Cleek M.D.

## 2012-11-01 ENCOUNTER — Other Ambulatory Visit: Payer: Self-pay | Admitting: Family Medicine

## 2012-11-01 MED ORDER — ATORVASTATIN CALCIUM 10 MG PO TABS: 10.0000 mg | ORAL_TABLET | Freq: Every day | ORAL | Status: DC

## 2012-11-02 DIAGNOSIS — Z0279 Encounter for issue of other medical certificate: Secondary | ICD-10-CM

## 2012-12-15 ENCOUNTER — Other Ambulatory Visit: Payer: Self-pay | Admitting: Internal Medicine

## 2013-02-15 DIAGNOSIS — G309 Alzheimer's disease, unspecified: Secondary | ICD-10-CM

## 2013-02-15 DIAGNOSIS — F0280 Dementia in other diseases classified elsewhere without behavioral disturbance: Secondary | ICD-10-CM

## 2013-02-15 DIAGNOSIS — IMO0001 Reserved for inherently not codable concepts without codable children: Secondary | ICD-10-CM

## 2013-02-15 DIAGNOSIS — E1149 Type 2 diabetes mellitus with other diabetic neurological complication: Secondary | ICD-10-CM

## 2013-02-15 DIAGNOSIS — E1142 Type 2 diabetes mellitus with diabetic polyneuropathy: Secondary | ICD-10-CM

## 2013-02-20 ENCOUNTER — Telehealth: Payer: Self-pay | Admitting: Internal Medicine

## 2013-02-20 NOTE — Telephone Encounter (Signed)
Please send call this order in as ap. Thanks [facility]

## 2013-02-20 NOTE — Telephone Encounter (Signed)
Paulino Door and gave verbal order per [facility].

## 2013-02-20 NOTE — Telephone Encounter (Signed)
[facility] would like a order for pt to be evaluated due to pressure ulcer stage 2 on great toe. [facility] would like verbal order to have nurse come out tomorrow,

## 2013-03-04 ENCOUNTER — Telehealth: Payer: Self-pay | Admitting: Internal Medicine

## 2013-03-04 NOTE — Telephone Encounter (Signed)
Pt has had 3  falls with in last wk. Pt is not hurt. [facility] would like nurse to return her call. Pt health is rapidly declining

## 2013-03-05 NOTE — Telephone Encounter (Signed)
I spoke to [place].  She has had 3 falls in the past 5 days.  Her dementia has become much worse.  She is now unable to follow basic commands.  Purposeful movements are now very hard.  There is no change in her vitals and she has no injuries from the falls.  She doesn't recall falling.  Pt's husband is reporting information.  Her toe wound is healing nicely.  Tremors are now much worse.

## 2013-03-05 NOTE — Telephone Encounter (Signed)
lmom for pt's husband to call back

## 2013-03-05 NOTE — Telephone Encounter (Signed)
Please contact the patient's husband and make 30 minute appt with [facility] for follow up of falls and dementia.

## 2013-03-05 NOTE — Telephone Encounter (Signed)
if acute  Change in mental status  she may need ed  Or neuro evaluation .to check for reversible causes .    However if felt to be not acute  Then OV   30 minutes  To discuss situation .

## 2013-03-06 NOTE — Telephone Encounter (Signed)
lmom 

## 2013-03-13 NOTE — Telephone Encounter (Signed)
lmom for Natasha West to call.

## 2013-04-26 ENCOUNTER — Encounter: Payer: Self-pay | Admitting: Internal Medicine

## 2013-04-26 ENCOUNTER — Ambulatory Visit (INDEPENDENT_AMBULATORY_CARE_PROVIDER_SITE_OTHER): Payer: Medicare Other | Admitting: Internal Medicine

## 2013-04-26 VITALS — BP 114/66 | Temp 97.5°F

## 2013-04-26 DIAGNOSIS — E1149 Type 2 diabetes mellitus with other diabetic neurological complication: Secondary | ICD-10-CM

## 2013-04-26 DIAGNOSIS — I10 Essential (primary) hypertension: Secondary | ICD-10-CM

## 2013-04-26 DIAGNOSIS — F0390 Unspecified dementia without behavioral disturbance: Secondary | ICD-10-CM

## 2013-04-26 DIAGNOSIS — R413 Other amnesia: Secondary | ICD-10-CM

## 2013-04-26 LAB — CBC WITH DIFFERENTIAL/PLATELET
BASOS ABS: 0 10*3/uL (ref 0.0–0.1)
Basophils Relative: 0.2 % (ref 0.0–3.0)
EOS ABS: 0.1 10*3/uL (ref 0.0–0.7)
Eosinophils Relative: 0.9 % (ref 0.0–5.0)
HEMATOCRIT: 41.1 % (ref 36.0–46.0)
HEMOGLOBIN: 13.9 g/dL (ref 12.0–15.0)
LYMPHS ABS: 1.7 10*3/uL (ref 0.7–4.0)
Lymphocytes Relative: 22.1 % (ref 12.0–46.0)
MCHC: 33.8 g/dL (ref 30.0–36.0)
MCV: 89.3 fl (ref 78.0–100.0)
MONOS PCT: 4.5 % (ref 3.0–12.0)
Monocytes Absolute: 0.3 10*3/uL (ref 0.1–1.0)
NEUTROS ABS: 5.6 10*3/uL (ref 1.4–7.7)
Neutrophils Relative %: 72.3 % (ref 43.0–77.0)
Platelets: 278 10*3/uL (ref 150.0–400.0)
RBC: 4.6 Mil/uL (ref 3.87–5.11)
RDW: 14.9 % — AB (ref 11.5–14.6)
WBC: 7.8 10*3/uL (ref 4.5–10.5)

## 2013-04-26 LAB — BASIC METABOLIC PANEL
BUN: 11 mg/dL (ref 6–23)
CO2: 28 mEq/L (ref 19–32)
Calcium: 9.4 mg/dL (ref 8.4–10.5)
Chloride: 102 mEq/L (ref 96–112)
Creatinine, Ser: 0.8 mg/dL (ref 0.4–1.2)
GFR: 72.68 mL/min (ref >60.00)
Glucose, Bld: 165 mg/dL — ABNORMAL HIGH (ref 70–99)
POTASSIUM: 3.9 meq/L (ref 3.5–5.1)
SODIUM: 138 meq/L (ref 135–145)

## 2013-04-26 LAB — LIPID PANEL
Cholesterol: 140 mg/dL (ref 0–200)
HDL: 51.3 mg/dL (ref >39.00)
LDL Cholesterol: 55 mg/dL (ref 0–99)
TRIGLYCERIDES: 169 mg/dL — AB (ref 0.0–149.0)
Total CHOL/HDL Ratio: 3
VLDL: 33.8 mg/dL (ref 0.0–40.0)

## 2013-04-26 LAB — TSH: TSH: 0.79 u[IU]/mL (ref 0.35–5.50)

## 2013-04-26 LAB — HEMOGLOBIN A1C: HEMOGLOBIN A1C: 7.3 % — AB (ref 4.6–6.5)

## 2013-04-26 MED ORDER — DONEPEZIL HCL 23 MG PO TABS: 23.0000 mg | ORAL_TABLET | Freq: Every day | ORAL | Status: DC

## 2013-04-26 MED ORDER — ATORVASTATIN CALCIUM 10 MG PO TABS: 10.0000 mg | ORAL_TABLET | Freq: Every day | ORAL | Status: AC

## 2013-04-26 MED ORDER — LISINOPRIL 5 MG PO TABS: 5.0000 mg | ORAL_TABLET | Freq: Every day | ORAL | Status: AC

## 2013-04-26 MED ORDER — METFORMIN HCL ER 500 MG PO TB24: 500.0000 mg | ORAL_TABLET | Freq: Two times a day (BID) | ORAL | Status: AC

## 2013-04-26 NOTE — Patient Instructions (Signed)
Can increase aricept to 22 mg maximum dose. Will notify you  of labs when available. Continue foot monitoring.  Make sure we have correct medication. FU depending o labs and how you are doing . ROV in about 4 months or as needed

## 2013-04-26 NOTE — Progress Notes (Signed)
Chief Complaint  Patient presents with  . Follow-up  . Diabetes  . Dementia    HPI: Patient comes in today for follow up of  multiple medical problems.  Here with daughter Natasha West DM no lows on metformin  HT good bp  Neuro[pathy  Feet checked frequently   And sees ortho also  Recurrent falls in past cant really walk with status and neuropathy  MS memory getting worse and progressing  family asks about increasing medication trial   Less eating sometimes swallowing issues but no choking  Etc  Has to get help to br  Used depends etc  Off vicodin and doesn't complain that much  Health Maintenance  Topic Date Due  . Tetanus/tdap  08/04/1948  . Colonoscopy  08/05/1979  . Zostavax  08/04/1989  . Influenza Vaccine  11/09/2012  . Pneumococcal Polysaccharide Vaccine Age 28 And Over  Completed   Health Maintenance Review   ROS: See pertinent positives and negatives per HPI.  Past Medical History  Diagnosis Date  . Diabetes mellitus   . Hyperlipidemia   . Hypertension   . Peripheral neuropathy     partial amputation rt foot  . Memory difficulty   . Foot infection     with sepsis  and amputation toes right  . Open wound of right foot 09/09/2011  . LOWER LIMB AMPUTATION, OTHER TOE 05/07/2008    Qualifier: Diagnosis of  By: Fabian Sharp MD, Neta Mends   . Lower limb amputation, great toe 06/30/2008    Qualifier: Diagnosis of  By: Fabian Sharp MD, Neta Mends     Family History  Problem Relation Age of Onset  . Alzheimer's disease Mother   . Other Father     cva endarterectomy  . Diabetes type II Daughter     History   Social History  . Marital Status: Married    Spouse Name: N/A    Number of Children: N/A  . Years of Education: N/A   Social History Main Topics  . Smoking status: Former Smoker    Quit date: 09/07/1991  . Smokeless tobacco: Never Used  . Alcohol Use: No  . Drug Use: No  . Sexual Activity: None   Other Topics Concern  . None   Social History Narrative   Married    Husband has vision problem and recurrent falls   Moved to retirement place   [place] 2010 closer to family   G4P3   Currently back to [place] after [employer] is prepared for them. 2 daughters checking on them   1 help her who comes in to help bathe and dress patient    Outpatient Encounter Prescriptions as of 04/26/2013  Medication Sig  . Ascorbic Acid (VITAMIN C) 100 MG tablet Take 100 mg by mouth daily.    [name] aspirin 81 MG chewable tablet Chew 81 mg by mouth daily.    [name] atorvastatin (LIPITOR) 10 MG tablet Take 1 tablet (10 mg total) by mouth daily.  . calcium-vitamin D (OSCAL WITH D) 250-125 MG-UNIT per tablet Take 1 tablet by mouth daily.  . metFORMIN (GLUCOPHAGE-XR) 500 MG 24 hr tablet Take 1 tablet (500 mg total) by mouth 2 (two) times daily.  . multivitamin (THERAGRAN) per tablet Take 1 tablet by mouth daily.    . Nutritional Supplements (JUICE PLUS FIBRE PO) Take by mouth.  . vitamin B-12 (CYANOCOBALAMIN) 100 MCG tablet Take 500 mcg by mouth daily. 1000 daily   . [DISCONTINUED] atorvastatin (LIPITOR)  10 MG tablet Take 1 tablet (10 mg total) by mouth daily.  . [DISCONTINUED] donepezil (ARICEPT) 10 MG tablet TAKE ONE TABLET BY MOUTH AT BEDTIME  . [DISCONTINUED] metFORMIN (GLUCOPHAGE-XR) 500 MG 24 hr tablet Take 1 tablet (500 mg total) by mouth 2 (two) times daily.  [name]. donepezil (ARICEPT) 23 MG TABS tablet Take 1 tablet (23 mg total) by mouth at bedtime.  [name]. lisinopril (PRINIVIL,ZESTRIL) 5 MG tablet Take 1 tablet (5 mg total) by mouth daily.  . [DISCONTINUED] HYDROcodone-acetaminophen (NORCO/VICODIN) 5-325 MG per tablet TAKE ONE-HALF TABLET BY MOUTH TWICE DAILY AS NEEDED FOR PAIN. MAY TAKE ONE-FOURTH TABLET  . [DISCONTINUED] lisinopril (PRINIVIL,ZESTRIL) 5 MG tablet   . [DISCONTINUED] sulfamethoxazole-trimethoprim (BACTRIM DS) 800-160 MG per tablet     EXAM:  BP 114/66  Temp(Src) 97.5 F (36.4 C) (Oral)  Cannot calculate BMI with a height equal to  zero.  GENERAL: vitals reviewed and listed above, alert, oriented, appears well hydrated and in no acute distress sitting in WC pleasant answeres ?  Oriented to persons and place not time  HEENT: atraumatic, conjunctiva  clear, no obvious abnormalities on inspection of external nose and ears hearing vision appear to be fine  NECK: no obvious masses on inspection palpation  LUNGS: rare wheeze bs = no rales rhonchi  CV: HRRR, no clubbing cyanosis or  peripheral edema nl cap refill  MS:  Right foot  Distal  Toe amputations  Clear few red spots  Left foot  deformity splinted 'hickened toe nail great no ulcer seen  Neuro oriented x 2  Couldn't give DOB.   Lab Results  Component Value Date   WBC 7.8 04/26/2013   HGB 13.9 04/26/2013   HCT 41.1 04/26/2013   PLT 278.0 04/26/2013   GLUCOSE 165* 04/26/2013   CHOL 140 04/26/2013   TRIG 169.0* 04/26/2013   HDL 51.30 04/26/2013   LDLDIRECT 77.8 09/20/2006   LDLCALC 55 04/26/2013   ALT 21 04/19/2012   AST 23 04/19/2012   NA 138 04/26/2013   K 3.9 04/26/2013   CL 102 04/26/2013   CREATININE 0.8 04/26/2013   BUN 11 04/26/2013   CO2 28 04/26/2013   TSH 0.79 04/26/2013   HGBA1C 7.3* 04/26/2013   MICROALBUR 1.9 05/19/2011    ASSESSMENT AND PLAN:  Discussed the following assessment and plan:  DIABETES MELLITUS, TYPE II, WITH NEUROLOGICAL COMPLICATIONS - Plan: Basic metabolic panel, Hemoglobin A1c, Lipid panel, TSH, CBC with Differential, lisinopril (PRINIVIL,ZESTRIL) 5 MG tablet, DISCONTINUED: lisinopril (PRINIVIL,ZESTRIL) 5 MG tablet  HYPERTENSION - Plan: Basic metabolic panel, Hemoglobin A1c, Lipid panel, TSH, CBC with Differential, lisinopril (PRINIVIL,ZESTRIL) 5 MG tablet, DISCONTINUED: lisinopril (PRINIVIL,ZESTRIL) 5 MG tablet  Memory difficulties - Plan: Basic metabolic panel, Hemoglobin A1c, Lipid panel, TSH, CBC with Differential, lisinopril (PRINIVIL,ZESTRIL) 5 MG tablet, DISCONTINUED: lisinopril (PRINIVIL,ZESTRIL) 5 MG tablet  Dementia - worsening in  crease medication  - Plan: Basic metabolic panel, Hemoglobin A1c, Lipid panel, TSH, CBC with Differential, lisinopril (PRINIVIL,ZESTRIL) 5 MG tablet, DISCONTINUED: lisinopril (PRINIVIL,ZESTRIL) 5 MG tablet Labs today  NF dementia cognition worsening ho ob other reason  . increase Aricept  consider adding namenda but uncert is would be that beneficial some ? of missing meds at times  And how to help adherence in home situation -Patient advised to return or notify health care team  if symptoms worsen or persist or new concerns arise. Declines flu vaccine  And so will hold on  prevnar at this time Tylenol for pain at this time  High risk  for falls but now in [facility] cause of Neuropathy amputation   Patient Instructions  Can increase aricept to 22 mg maximum dose. Will notify you  of labs when available. Continue foot monitoring.  Make sure we have correct medication. FU depending o labs and how you are doing . ROV in about 4 months or as needed   Neta Mends. Vaun Hyndman M.D.  Pre visit review using our clinic review tool, if applicable. No additional management support is needed unless otherwise documented below in the visit note. Total visit > 50% spent counseling and coordinating care

## 2013-04-29 ENCOUNTER — Telehealth: Payer: Self-pay

## 2013-04-29 NOTE — Telephone Encounter (Signed)
Since Saturday pt has gotten very weak.  Had a nurse come check her vital signs and everything was ok.  Nurse told pt's daughter to check on pt's lab to see if pt was anemic.  Pt's daughter calling to check on labs (given lab results).  The nurse says that pt is getting depressed and maybe the pt needs an antidepressant. Pls advise if pt needs an antidepressant.

## 2013-04-29 NOTE — Telephone Encounter (Signed)
Per Dr. Fabian SharpPanosh - why do you think pt is depressed?  Did you increase the Aricept to 23 mg?  Left a message for return call.

## 2013-04-29 NOTE — Telephone Encounter (Signed)
Relevant patient education mailed to patient.  

## 2013-04-29 NOTE — Telephone Encounter (Signed)
Pt's daughter states she sent the prescription off to mail order.  Per pt's daughter she spoke with pt this morning and pt states she cannot do anything for herself.  Pt's daughter offered to help pt get up and pt responded " I wish I could do this for myself".  Pt's daughter feels pt is depressed due to her declining health. Pls advise.

## 2013-04-30 NOTE — Telephone Encounter (Signed)
Pt returned my call.  Left message on my machine.  Called her back.  Left a message at the below listed number.

## 2013-04-30 NOTE — Telephone Encounter (Signed)
Left message at below listed number for the pt's daughter to return my call.

## 2013-04-30 NOTE — Telephone Encounter (Signed)
We can try low dose zoloft but  Any of these meds with potential side effects . Need to monitor  Sodium level.and med response.  Trial sertraline  25 mg take 1 po qd  In am can increase to 2 per day as directed  disp 30  Refill x 1  Plan bmp (at visit ) ROVin  A month or as needed.

## 2013-05-01 NOTE — Telephone Encounter (Signed)
Left a message at the below listed phone number for the patient's daughter to return my call.

## 2013-05-01 NOTE — Telephone Encounter (Signed)
I spoke to the patient's daughter.  She does not want to start a new medication at this time since she just increased her Aricept.  She will call back when she is ready to have it filled.  Will also make an office visit 3-4 weeks after starting the new medication.

## 2013-05-09 ENCOUNTER — Telehealth: Payer: Self-pay | Admitting: Internal Medicine

## 2013-05-09 NOTE — Telephone Encounter (Signed)
Patient Information:  Caller Name: Lori/ Daughter  Phone: [phone]  Patient: Natasha West, Natasha West  Gender: Female  DOB: 05-25-1927  Age: 80 Years  PCP: Berniece AndreasPanosh, [facility]  Office Follow Up:  Does the office need to follow up with this patient?: No  Instructions For The Office: N/A  RN Note:  Daughter will check on her mother and call back for triage.  Understanding expressed.  Symptoms  Reason For Call & Symptoms: Daughter states that her father told her that her mother was red around the vaginal area and anus. She is unable to answer questions, of redness, fever, pain, swelling or drainage. Onset ?  She has not seen her mother in 3 days.   She states her father is legally blind and was told this by caregivers that are caring for her mother.  Encouraged daughter to check on her mother, or have someone call that can give provide information.  Understanding expressed.  Reviewed Health History In EMR: N/A  Reviewed Medications In EMR: N/A  Reviewed Allergies In EMR: N/A  Reviewed Surgeries / Procedures: N/A  Date of Onset of Symptoms: 05/09/2013  Guideline(s) Used:  No Protocol Available - Information Only  Disposition Per Guideline:   Home Care  Reason For Disposition Reached:   Information only question and nurse able to answer  Advice Given:  Call Back If:  New symptoms develop  You become worse.  Patient Will Follow Care Advice:  YES

## 2013-05-09 NOTE — Telephone Encounter (Signed)
Noted  

## 2013-05-15 ENCOUNTER — Telehealth: Payer: Self-pay | Admitting: Internal Medicine

## 2013-05-15 NOTE — Telephone Encounter (Signed)
Relevant patient education mailed to patient.  

## 2013-05-16 NOTE — Telephone Encounter (Addendum)
This appt was scheduled 04/26/13

## 2013-05-27 ENCOUNTER — Telehealth: Payer: Self-pay | Admitting: Internal Medicine

## 2013-05-27 NOTE — Telephone Encounter (Signed)
Natasha DikeJennifer is calling from [facility], regarding nurse services for pt, states she faxed over the referral on 05/22/13, pt has womb on foot and its getting worse.

## 2013-05-29 ENCOUNTER — Ambulatory Visit (INDEPENDENT_AMBULATORY_CARE_PROVIDER_SITE_OTHER): Payer: Medicare Other | Admitting: Family

## 2013-05-29 ENCOUNTER — Encounter: Payer: Self-pay | Admitting: Family

## 2013-05-29 VITALS — BP 120/64 | HR 91

## 2013-05-29 DIAGNOSIS — E1165 Type 2 diabetes mellitus with hyperglycemia: Principal | ICD-10-CM

## 2013-05-29 DIAGNOSIS — S91309A Unspecified open wound, unspecified foot, initial encounter: Secondary | ICD-10-CM

## 2013-05-29 DIAGNOSIS — IMO0001 Reserved for inherently not codable concepts without codable children: Secondary | ICD-10-CM

## 2013-05-29 MED ORDER — DOXYCYCLINE HYCLATE 100 MG PO TABS: 100.0000 mg | ORAL_TABLET | Freq: Two times a day (BID) | ORAL | Status: DC

## 2013-05-29 NOTE — Telephone Encounter (Signed)
Paper work dropped off at the front desk today.  Given to Dr. Fabian SharpPanosh.  Pt seen Natasha West today.  Dr. Fabian SharpPanosh will discuss office visit with [name].

## 2013-05-29 NOTE — Progress Notes (Signed)
Pre visit review using our clinic review tool, if applicable. No additional management support is needed unless otherwise documented below in the visit note. 

## 2013-05-29 NOTE — Progress Notes (Signed)
Subjective:    Patient ID: Natasha West, female    DOB: 1928-02-28, 80 y.o.   MRN: [id number]  HPI 80 y.o. White female presents today with chief complaint of " heel ulcer". Pt is accompanied by her daughter as patient is a poor historian and has limited mobility. Pt has history of diabetes peripheral nephrapathy, and multiple foot ulcers. This heel ulcer was noticed 2 weeks ago and originally looked like "a blister". Yesterday the family noticed the blister had gotten much larger, the blister had opened and there was yellow drainage. Pt has not had fevers, chills, weight change.     Review of Systems  Constitutional: Negative.   HENT: Negative.   Eyes: Negative.   Respiratory: Negative.   Cardiovascular: Negative.   Gastrointestinal: Negative.   Endocrine: Negative.   Genitourinary: Negative.   Musculoskeletal:       Wheelchair bound.   Skin: Positive for wound.       To right heel, with yellow drainage and peeling blister.  - Bottom of left foot wound with yellow drainage.   Allergic/Immunologic: Negative.   Neurological: Negative.   Hematological: Negative.   Psychiatric/Behavioral: Negative.        Past Medical History  Diagnosis Date  . Diabetes mellitus   . Hyperlipidemia   . Hypertension   . Peripheral neuropathy     partial amputation rt foot  . Memory difficulty   . Foot infection     with sepsis  and amputation toes right  . Open wound of right foot 09/09/2011  . LOWER LIMB AMPUTATION, OTHER TOE 05/07/2008    Qualifier: Diagnosis of  By: Fabian Sharp MD, Neta Mends   . Lower limb amputation, great toe 06/30/2008    Qualifier: Diagnosis of  By: Fabian Sharp MD, Neta Mends     History   Social History  . Marital Status: Married    Spouse Name: N/A    Number of Children: N/A  . Years of Education: N/A   Occupational History  . Not on file.   Social History Main Topics  . Smoking status: Former Smoker    Quit date: 09/07/1991  . Smokeless tobacco: Never Used  .  Alcohol Use: No  . Drug Use: No  . Sexual Activity: Not on file   Other Topics Concern  . Not on file   Social History Narrative   Married    Husband has vision problem and recurrent falls   Moved to retirement place   [place] 2010 closer to family   G4P3   Currently back to [place] after [employer] is prepared for them. 2 daughters checking on them   1 help her who comes in to help bathe and dress patient    Past Surgical History  Procedure Laterality Date  . Total knee arthroplasty    . Elbow fracture surgery      left with orif  . Amputation  2009    from gangrene rt 4th toe Reg[place]  . Amputation  03 15 2010    Rt great toe and rt distal phalanx  . Toe amputation  1 12    right 3rd for ulcer  [place]   . Right amputation  7 12     with sepsis  . Appendectomy    . Amputation  09/09/2011    Procedure: AMPUTATION DIGIT;  Surgeon: Harvie Junior, MD;  Location: [facility];  Service: Orthopedics;  Laterality: Right;  revision amputation midfoot    Family History  Problem Relation Age of Onset  . Alzheimer's disease Mother   . Other Father     cva endarterectomy  . Diabetes type II Daughter     Allergies  Allergen Reactions  . Penicillins     REACTION: unspecified    Current Outpatient Prescriptions on File Prior to Visit  Medication Sig Dispense Refill  . Ascorbic Acid (VITAMIN C) 100 MG tablet Take 100 mg by mouth daily.        [name]. aspirin 81 MG chewable tablet Chew 81 mg by mouth daily.        [name]. atorvastatin (LIPITOR) 10 MG tablet Take 1 tablet (10 mg total) by mouth daily.  90 tablet  3  . calcium-vitamin D (OSCAL WITH D) 250-125 MG-UNIT per tablet Take 1 tablet by mouth daily.      [name]. donepezil (ARICEPT) 23 MG TABS tablet Take 1 tablet (23 mg total) by mouth at bedtime.  90 tablet  3  . lisinopril (PRINIVIL,ZESTRIL) 5 MG tablet Take 1 tablet (5 mg total) by mouth daily.  90 tablet  3  . metFORMIN (GLUCOPHAGE-XR) 500 MG 24  hr tablet Take 1 tablet (500 mg total) by mouth 2 (two) times daily.  180 tablet  3  . multivitamin (THERAGRAN) per tablet Take 1 tablet by mouth daily.        . Nutritional Supplements (JUICE PLUS FIBRE PO) Take by mouth.      . vitamin B-12 (CYANOCOBALAMIN) 100 MCG tablet Take 500 mcg by mouth daily. 1000 daily        No current facility-administered medications on file prior to visit.    BP 120/64  Pulse 91  SpO2 98%chart Objective:   Physical Exam  Constitutional: She appears well-developed. She is active and cooperative.  Cardiovascular: Normal rate, regular rhythm and normal heart sounds.   Pulses:      Dorsalis pedis pulses are 1+ on the right side, and 1+ on the left side.       Posterior tibial pulses are 1+ on the right side, and 1+ on the left side.  Pulmonary/Chest: Effort normal and breath sounds normal.  Musculoskeletal:  Decrease ROM to legs bilaterally. Stiffness to legs bilaterally.   Neurological: She is disoriented.  Skin: Lesion noted.  Right heel- Open wound with blister, purulent yellow drainage present. approx 1.5cm.  Left foot- Bottom of left foot wound present with purulent yellow drainage. 1cm          Assessment & Plan:  80 y.o. White female who presents today with her daughter with chief complaint of "heel wound" - Right heel wound/ left bottom of foot wound  - Doxycycline 100mg  BID   - Consult to wound clinic   - Keep foot elevated as much as possible.   - Keep wound clean and dry.   - Culture to right heel.  - Type 2 diabetes and Peripheral neuropathy  - continue current regime.  Education: wound care education, skin break down education.  Followup : 1 week.

## 2013-05-29 NOTE — Patient Instructions (Addendum)
Skin Ulcer A skin ulcer is an open sore that can be shallow or deep. Skin ulcers sometimes become infected and are difficult to treat. It may be 1 month or longer before real healing progress is made. CAUSES   Injury.  Problems with the veins or arteries.  Diabetes.  Insect bites.  Bedsores.  Inflammatory conditions. SYMPTOMS   Pain, redness, swelling, and tenderness around the ulcer.  Fever.  Bleeding from the ulcer.  Yellow or clear fluid coming from the ulcer. DIAGNOSIS  There are many types of skin ulcers. Any open sores will be examined. Certain tests will be done to determine the kind of ulcer you have. The right treatment depends on the type of ulcer you have. TREATMENT  Treatment is a long-term challenge. It may include:  Wearing an elastic wrap, compression stockings, or gel cast over the ulcer area.  Taking antibiotic medicines or putting antibiotic creams on the affected area if there is an infection. HOME CARE INSTRUCTIONS  Put on your bandages (dressings), wraps, or casts over the ulcer as directed by your caregiver.  Change all dressings as directed by your caregiver.  Take all medicines as directed by your caregiver.  Keep the affected area clean and dry.  Avoid injuries to the affected area.  Eat a well-balanced, healthy diet that includes plenty of fruit and vegetables.  If you smoke, consider quitting or decreasing the amount of cigarettes you smoke.  Once the ulcer heals, get regular exercise as directed by your caregiver.  Work with your caregiver to make sure your blood pressure, cholesterol, and diabetes are well-controlled.  Keep your skin moisturized. Dry skin can crack and lead to skin ulcers. SEEK IMMEDIATE MEDICAL CARE IF:   Your pain gets worse.  You have swelling, redness, or fluids around the ulcer.  You have chills.  You have a fever. MAKE SURE YOU:   Understand these instructions.  Will watch your condition.  Will get  help right away if you are not doing well or get worse. Document Released: 05/05/2004 Document Revised: 06/20/2011 Document Reviewed: 11/12/2010 [facility] Patient Information 2014 [place], [place]. Dressing Change A dressing is a material placed over wounds. It keeps the wound clean, dry, and protected from further injury. This provides an environment that favors wound healing.  BEFORE YOU BEGIN  Get your supplies together. Things you may need include:  Saline solution.  Flexible gauze dressing.  Medicated cream.  Tape.  Gloves.  Abdominal dressing pads.  Gauze squares.  Plastic bags.  Take pain medicine 30 minutes before the dressing change if you need it.  Take a shower before you do the first dressing change of the day. Use plastic wrap or a plastic bag to prevent the dressing from getting wet. REMOVING YOUR OLD DRESSING   Wash your hands with soap and water. Dry your hands with a clean towel.  Put on your gloves.  Remove any tape.  Carefully remove the old dressing. If the dressing sticks, you may dampen it with warm water to loosen it, or follow your caregiver's specific directions.  Remove any gauze or packing tape that is in your wound.  Take off your gloves.  Put the gloves, tape, gauze, or any packing tape into a plastic bag. CHANGING YOUR DRESSING  Open the supplies.  Take the cap off the saline solution.  Open the gauze package so that the gauze remains on the inside of the package.  Put on your gloves.  Clean your wound as told  by your caregiver.  If you have been told to keep your wound dry, follow those instructions.  Your caregiver may tell you to do one or more of the following:  Pick up the gauze. Pour the saline solution over the gauze. Squeeze out the extra saline solution.  Put medicated cream or other medicine on your wound if you have been told to do so.  Put the solution soaked gauze only in your wound, not on the skin around  it.  Pack your wound loosely or as told by your caregiver.  Put dry gauze on your wound.  Put abdominal dressing pads over the dry gauze if your wet gauze soaks through.  Tape the abdominal dressing pads in place so they will not fall off. Do not wrap the tape completely around the affected part (arm, leg, abdomen).  Wrap the dressing pads with a flexible gauze dressing to secure it in place.  Take off your gloves. Put them in the plastic bag with the old dressing. Tie the bag shut and throw it away.  Keep the dressing clean and dry until your next dressing change.  Wash your hands. SEEK MEDICAL CARE IF:  Your skin around the wound looks red.  Your wound feels more tender or sore.  You see pus in the wound.  Your wound smells bad.  You have a fever.  Your skin around the wound has a rash that itches and burns.  You see black or yellow skin in your wound that was not there before.  You feel nauseous, throw up, and feel very tired. Document Released: 05/05/2004 Document Revised: 06/20/2011 Document Reviewed: 02/07/2011 [facility] Patient Information 2014 [place].

## 2013-05-30 ENCOUNTER — Telehealth: Payer: Self-pay | Admitting: Internal Medicine

## 2013-05-30 NOTE — Telephone Encounter (Signed)
[facility] Triage Call Report Triage Record Num: [id number] Operator: Craig GuessBarbara Busick Patient Name: Natasha RuaCarolyn West Call Date & Time: 05/29/2013 7:25:00PM Patient Phone: [phone] PCP: Neta MendsWanda K. Panosh Patient Gender: Female PCP Fax : [phone] Patient DOB: Jan 13, 1928 [facility] Reason for Call: Caller: Natasha West/Daughter PCP: Berniece AndreasPanosh, Wanda (Family Practice); CB#: [phone]; Call regarding Medication Issue; Medication(s): Caller reports her mother was seen in the office today, Wed 2/18 for infection of her right heel. [facility] EPIC record reviewed. Script was sent to and received by [facility] [place]. Per EPIC record: RN called in the following doxycycline (VIBRA-TABS) 100 MG tablet [[id number]][[id number]] Order Details Dose: 100 mg Route: Oral Frequency: 2 times daily Dispense Quantity: 20 tablet Refills: 0 Fills Remaining: 0 Sig: Take 1 tablet (100 mg total) by mouth 2 (two) times daily. Written Date: 05/29/13 Expiration Date: 05/29/14 [facility] [address] at [phone]. Caller made aware. Protocol(s) Used: Medication Questions - Adult Recommended Outcome per Protocol: Provided Health Information Reason for Outcome: Caller has medication question(s) that was answered with available resources Care Advice: ~ 02/

## 2013-05-30 NOTE — Telephone Encounter (Signed)
Paper work sent to the front to be faxed.

## 2013-06-01 LAB — WOUND CULTURE: Gram Stain: NONE SEEN

## 2013-06-07 ENCOUNTER — Encounter: Payer: Self-pay | Admitting: Internal Medicine

## 2013-06-07 ENCOUNTER — Ambulatory Visit (INDEPENDENT_AMBULATORY_CARE_PROVIDER_SITE_OTHER): Payer: Medicare Other | Admitting: Internal Medicine

## 2013-06-07 VITALS — BP 140/74 | HR 98 | Temp 97.7°F

## 2013-06-07 DIAGNOSIS — E1142 Type 2 diabetes mellitus with diabetic polyneuropathy: Secondary | ICD-10-CM

## 2013-06-07 DIAGNOSIS — R6889 Other general symptoms and signs: Secondary | ICD-10-CM

## 2013-06-07 DIAGNOSIS — Z23 Encounter for immunization: Secondary | ICD-10-CM

## 2013-06-07 DIAGNOSIS — Z899 Acquired absence of limb, unspecified: Secondary | ICD-10-CM

## 2013-06-07 DIAGNOSIS — L97509 Non-pressure chronic ulcer of other part of unspecified foot with unspecified severity: Secondary | ICD-10-CM

## 2013-06-07 DIAGNOSIS — I10 Essential (primary) hypertension: Secondary | ICD-10-CM

## 2013-06-07 DIAGNOSIS — E1149 Type 2 diabetes mellitus with other diabetic neurological complication: Secondary | ICD-10-CM

## 2013-06-07 DIAGNOSIS — R195 Other fecal abnormalities: Secondary | ICD-10-CM

## 2013-06-07 DIAGNOSIS — G609 Hereditary and idiopathic neuropathy, unspecified: Secondary | ICD-10-CM

## 2013-06-07 DIAGNOSIS — F0390 Unspecified dementia without behavioral disturbance: Secondary | ICD-10-CM

## 2013-06-07 MED ORDER — DONEPEZIL HCL 10 MG PO TBDP: 10.0000 mg | ORAL_TABLET | Freq: Every day | ORAL | Status: AC

## 2013-06-07 MED ORDER — DOXYCYCLINE HYCLATE 100 MG PO TABS: 100.0000 mg | ORAL_TABLET | Freq: Two times a day (BID) | ORAL | Status: DC

## 2013-06-07 NOTE — Patient Instructions (Addendum)
Keep appt with wound clinic  On Monday  .  I agree with going back down on the aricept 10 mg for now to see helps.   stay on antibiotic for now. Until seen in wound clinic.

## 2013-06-07 NOTE — Progress Notes (Signed)
Chief Complaint  Patient presents with  . Follow-up    Patient is having a problem with the new strength of Aricept.  It is causing diarrhea.  Has an FL2 Form to be filled out.    HPI: Patient comes in followup with daughter for her weaned on left toe and right heel However hasn't at L2 to fill out because of considering transitioning care because of her increasing medical needs. And need for total assisted care. Is alert and will discuss but has no memory as to needs is basically in a wheelchair uses depends will use the bathroom onto otherwise incontinent. Is on doxycycline daughter doesn't know if  better nurse couldn't come out this week because of snow weather conditions.  No fever has decreased appetite but always tends to have this  Has been having loose stools and other daughter decrease the Aricept back to 10 mg feeling that it was from the higher dose Aricept.   ROS: See pertinent positives and negatives per HPI. No current coughing respiratory distress vomiting or noted fever loose stools unclear how often no blood no fever.  Past Medical History  Diagnosis Date  . Diabetes mellitus   . Hyperlipidemia   . Hypertension   . Peripheral neuropathy     partial amputation rt foot  . Memory difficulty   . Foot infection     with sepsis  and amputation toes right  . Open wound of right foot 09/09/2011  . LOWER LIMB AMPUTATION, OTHER TOE 05/07/2008    Qualifier: Diagnosis of  By: Fabian SharpPanosh MD, Neta MendsWanda K   . Lower limb amputation, great toe 06/30/2008    Qualifier: Diagnosis of  By: Fabian SharpPanosh MD, Neta MendsWanda K     Family History  Problem Relation Age of Onset  . Alzheimer's disease Mother   . Other Father     cva endarterectomy  . Diabetes type II Daughter     History   Social History  . Marital Status: Married    Spouse Name: N/A    Number of Children: N/A  . Years of Education: N/A   Social History Main Topics  . Smoking status: Former Smoker    Quit date: 09/07/1991  .  Smokeless tobacco: Never Used  . Alcohol Use: No  . Drug Use: No  . Sexual Activity: None   Other Topics Concern  . None   Social History Narrative   Married    Husband has vision problem and recurrent falls   [place]oved [place]   [place] closer to family   G4P3   [place] [employer]     Food is prepared for them. 2 daughters checking on them   1 help her who comes in to help bathe and dress patient    Outpatient Encounter Prescriptions as of 06/07/2013  Medication Sig  . Ascorbic Acid (VITAMIN C) 100 MG tablet Take 100 mg by mouth daily.    [name]. aspirin 81 MG chewable tablet Chew 81 mg by mouth daily.    [name]. atorvastatin (LIPITOR) 10 MG tablet Take 1 tablet (10 mg total) by mouth daily.  . calcium-vitamin D (OSCAL WITH D) 250-125 MG-UNIT per tablet Take 1 tablet by mouth daily.  [name]. doxycycline (VIBRA-TABS) 100 MG tablet Take 1 tablet (100 mg total) by mouth 2 (two) times daily.  [name]. lisinopril (PRINIVIL,ZESTRIL) 5 MG tablet Take 1 tablet (5 mg total) by mouth daily.  . metFORMIN (GLUCOPHAGE-XR) 500 MG 24 hr  tablet Take 1 tablet (500 mg total) by mouth 2 (two) times daily.  . multivitamin (THERAGRAN) per tablet Take 1 tablet by mouth daily.    . Nutritional Supplements (JUICE PLUS FIBRE PO) Take by mouth.  . vitamin B-12 (CYANOCOBALAMIN) 100 MCG tablet Take 500 mcg by mouth daily. 1000 daily   . [DISCONTINUED] donepezil (ARICEPT) 23 MG TABS tablet Take 1 tablet (23 mg total) by mouth at bedtime.  . [DISCONTINUED] doxycycline (VIBRA-TABS) 100 MG tablet Take 1 tablet (100 mg total) by mouth 2 (two) times daily.  [name] donepezil (ARICEPT ODT) 10 MG disintegrating tablet Take 1 tablet (10 mg total) by mouth at bedtime.    EXAM:  BP 140/74  Pulse 98  Temp(Src) 97.7 F (36.5 C) (Oral)  SpO2 98%  Cannot calculate BMI with a height equal to zero. Well-developed alert well-groomed elderly lady in no acute distress in wheelchair. She is  verbal one-word answers behaviorally, unable to give history Extremities right foot partial amputation some callused slightly red area at the right heel but no deep ulcer Left foot great toe contracted ulcer at the I P. redness to base of toe. Previous clinician came and looked at her foot and thinks it is slightly better No streaking no pain and she has neuropathy ASSESSMENT AND PLAN:  Discussed the following assessment and plan:  DIABETIC FOOT ULCER, TOE - To be seen in wound clinic after weekend  DIABETES MELLITUS, TYPE II, WITH NEUROLOGICAL COMPLICATIONS  PERIPHERAL NEUROPATHY  HYPERTENSION  Polyneuropathy in diabetes(357.2)  Hx of amputation multiple toes and forefoot .  Dementia  Need for vaccination with 13-polyvalent pneumococcal conjugate vaccine - Plan: Pneumococcal conjugate vaccine 13-valent  Loose stools FL2 form filled out as best possible attached med list and diagnoses.with daughter  Increased needs total care group with looking at transitional places. Husband is also having medical issues and would like to stay in care with his wife. He has visual limitations but not dementia. Dry dressing at this time continue antibiotic and still seen by wound clinic Agree with changing Aricept back down to 10 mg. Followup if persistent progressive GI disturbance. Advise sign up for my chart also for help.  -Patient advised to return or notify health care team  if symptoms worsen or persist or new concerns arise.  Patient Instructions  Keep appt with wound clinic  On Monday  .  I agree with going back down on the aricept 10 mg for now to see helps.   stay on antibiotic for now. Until seen in wound clinic.   Neta Mends. Panosh M.D.  Pre visit review using our clinic review tool, if applicable. No additional management support is needed unless otherwise documented below in the visit note. Total visit > 50% spent counseling and coordinating care forms planning

## 2013-06-10 ENCOUNTER — Encounter (HOSPITAL_BASED_OUTPATIENT_CLINIC_OR_DEPARTMENT_OTHER): Payer: Medicare Other | Attending: General Surgery

## 2013-06-10 ENCOUNTER — Telehealth: Payer: Self-pay | Admitting: Internal Medicine

## 2013-06-10 DIAGNOSIS — L89609 Pressure ulcer of unspecified heel, unspecified stage: Secondary | ICD-10-CM | POA: Insufficient documentation

## 2013-06-10 DIAGNOSIS — M2040 Other hammer toe(s) (acquired), unspecified foot: Secondary | ICD-10-CM | POA: Insufficient documentation

## 2013-06-10 DIAGNOSIS — L8990 Pressure ulcer of unspecified site, unspecified stage: Secondary | ICD-10-CM | POA: Insufficient documentation

## 2013-06-10 DIAGNOSIS — S98919A Complete traumatic amputation of unspecified foot, level unspecified, initial encounter: Secondary | ICD-10-CM | POA: Insufficient documentation

## 2013-06-10 LAB — GLUCOSE, CAPILLARY: Glucose-Capillary: 162 mg/dL — ABNORMAL HIGH (ref 70–99)

## 2013-06-10 NOTE — Telephone Encounter (Signed)
Relevant patient education assigned to patient using Emmi. ° °

## 2013-06-10 NOTE — Telephone Encounter (Signed)
Spoke to [place] who was able to get the prescription at the local pharmacy.

## 2013-06-10 NOTE — Telephone Encounter (Signed)
[facility]  [facility] Triage Call Report Triage Record Num: [id number] Operator: Chevis PrettyKatherine Smith Patient Name: Natasha RuaCarolyn Lorincz Call Date & Time: 06/08/2013 3:49:09PM Patient Phone: [phone] PCP: Neta MendsWanda K. Panosh Patient Gender: Female PCP Fax : [phone] Patient DOB: 1927/05/03 [facility] Reason for Call: Caller: Natasha West (daughter)/Other; PCP: Natasha AndreasPanosh, Wanda (Family Practice); CB#: [phone]; Call regarding Diabetic, Dx: Infection in toe, the pharmacy said one of the two prescriptions prescribed was sent to another pharmacy? Caller states given 2 paper Rx while in office, one for change in aricept Rx, and one for vibratabs/doxycycline. [facility]. States pharmacist tells her the aricept could be filled, but the vibratabs "have already been billed and filled by someone other than us," ; insurance refuses to pay the claim due to "too soon to fill." States unable to reach [facility] to cancel order; TC to Dr. Milinda Antisower. Advised to try to fill Rx with short term amount of the prescription until insurance can be contacted 06/10/13. Protocol(s) Used: Medication Questions - Adult Recommended Outcome per Protocol: Call Dispensing Pharmacy or Provider Immediately Reason for Outcome: Unable to obtain prescribed medication related to available resources AND situation poses immediate clinical risk Care Advice: ~

## 2013-06-11 NOTE — Progress Notes (Signed)
[facility]  NAME:  Natasha West, Natasha West               ACCOUNT NO.:  [id number]  MEDICAL RECORD NO.:  [id number]      DATE OF BIRTH:  March 06, 1928  PHYSICIAN:  Wayland Denislaire Sanger, DO       VISIT DATE:  06/10/2013                                  OFFICE VISIT   The patient is an 80 year old female, who is here for evaluation of her foot.     Wayland Denislaire Sanger, DO     CS/MEDQ  D:  06/10/2013  T:  06/11/2013  Job:  [id number]

## 2013-06-11 NOTE — Progress Notes (Signed)
[facility]  NAME:  Natasha West, [name]               ACCOUNT NO.:  [id number]  MEDICAL RECORD NO.:  [id number]      DATE OF BIRTH:  [birth year]  PHYSICIAN:  Wayland Denislaire Sanger, DO       VISIT DATE:  06/10/2013                                  OFFICE VISIT   HISTORY:  The patient is an [age] year old white female here with her daughter for evaluation of a right heel ulcer.  She has had this for several weeks.  It started with the blister.  The blister opened.  Now, she has a wound and has had a little bit of drainage, and if pillow was not placed under her cast, she gets fair bit of bleeding.  She is wheelchair bound and has been putting triple antibiotic on it for the last couple of weeks with Home Health assisting every couple of days.  PAST MEDICAL HISTORY:  Positive for diabetes, hyperlipidemia, hypertension, peripheral neuropathy, difficulty with memory, foot infections, open wound of the right foot, lower limb amputation, which looks like a transmetatarsal great toe amputation.  SOCIAL HISTORY:  Former smoker.  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  PAST SURGICAL HISTORY:  Total knee arthroplasty, elbow fracture, amputation of the right distal phalanges, appendectomy.  ALLERGIES:  PENICILLIN.  MEDICATIONS:  Vitamin C, aspirin, Lipitor, Os-Cal, Aricept, Prinivil, Glucophage, Theragran, vitamin B12.  REVIEW OF SYSTEMS:  Negative other than stated above.  PHYSICAL EXAMINATION:  She is alert and communicative.  Her daughter gives most of the history.  She is not in any acute distress.  She has no voluntary movement noted in the lower extremity.  Her pupils are equal.  Her extraocular muscles are intact.  She does not have any cervical lymphadenopathy.  Her breathing is not labored.  Legs are not edematous.  Her abdomen is large, but soft and nontender.  Her pulses are present, but weak in the lower  extremities, strong and regular in the upper extremities.  She has got a well-healed transmetatarsal on the right.  She has a tiny ulcer on the dorsal aspect of a great toe on the left and then the heel ulcer on the right.  It does not look like it is too deep.  It has a little bit of fibrous tissue.  She does have feeling.  My recommendation is for protein shakes, check a pre-albumin, consult for air mattress bed, multivitamin, vitamin C, zinc, offloading Santyl and a wrap, and order a boot that would take the pressure off her heel, and her daughter is in agreement for this.     Wayland Denislaire Sanger, DO     CS/MEDQ  D:  06/10/2013  T:  06/11/2013  Job:  [id number]

## 2013-06-17 LAB — GLUCOSE, CAPILLARY: Glucose-Capillary: 180 mg/dL — ABNORMAL HIGH (ref 70–99)

## 2013-06-18 DIAGNOSIS — E1142 Type 2 diabetes mellitus with diabetic polyneuropathy: Secondary | ICD-10-CM

## 2013-06-18 DIAGNOSIS — L97509 Non-pressure chronic ulcer of other part of unspecified foot with unspecified severity: Secondary | ICD-10-CM

## 2013-06-18 DIAGNOSIS — E1169 Type 2 diabetes mellitus with other specified complication: Secondary | ICD-10-CM

## 2013-06-18 DIAGNOSIS — L97409 Non-pressure chronic ulcer of unspecified heel and midfoot with unspecified severity: Secondary | ICD-10-CM

## 2013-06-18 DIAGNOSIS — E1149 Type 2 diabetes mellitus with other diabetic neurological complication: Secondary | ICD-10-CM

## 2013-06-18 NOTE — Progress Notes (Signed)
[facility]  NAME:  Natasha West, Mitchell               ACCOUNT NO.:  [id number]  MEDICAL RECORD NO.:  [id number]      DATE OF BIRTH:  24-May-1927  PHYSICIAN:  Glenna FellowsBrinda Estanislado Surgeon, MD    VISIT DATE:  06/17/2013                                  OFFICE VISIT   HISTORY:  The patient is an 80 year old nonambulatory female who is here for followup of pressure ulceration of her right heel.  The patient has a history of transmetatarsal amputation on this side due to hammertoe and subsequent ulcerations.  She also has developed hammertoe over her left foot.  At her last visit, it was discussed that she would benefit from air onlay mattress as well as offloading boots.  It does not appear that either of these have been addressed.  They also report significant difficulty in presenting here weekly, and are concerned that she is developing increasing pressure and maceration over her buttocks due to the transportation and in transfers. The daughter that accompanies her today states that the plan is to move to a nursing facility at some point in the near future; however, no Definite plans have been made for this presently.  The daughter also is concerned that she is not able to participate or communicate with the home health needs as the contact is listed as her father. Her father himself is disabled and cannot provide information. We will communicate with the Home Health Agency that the daughter be contacted for all concerns. Prealbumin was also recommended at last visit, it does not appear that this has been drawn by Home Health.  PHYSICAL EXAMINATION:  VITAL SIGNS:  Blood pressure is 103/63, blood glucose is 130, pulse is 108, temperature is 98.1. EXTREMITIES:  Left great toe has hammertoe deformity over the dorsum and eschar has lifted.  There is small wound measuring 0.4 x 0.3 x 0.1 that is considered a Wagner grade 2.  Over her right heel, she also has additional Wagner grade 2  ulceration that has much less slough than noted at her last visit.  Wound size is measuring 1.5 x 1.3 x 0.1 cm. There is no surrounding cellulitis and scant drainage.  No debridement was performed today.  PLAN:  The majority of the time was spent with the daughter, discussing plan.  We will plan for hydrocolloid protection over the left great toe. Over the left heel, plan for collagen followed by heel protecting foam. They do have Home Health.  We will plan to reorder the Onlay mattress, and they will need protective boots over bilateral lower extremities such as a PRAFO boot.  We will plan for followup in 1 month's time, but encouraged the daughter to call here if it does not appear there has been any progress on the offloading measures.          ______________________________ Glenna FellowsBrinda Pamla Pangle, MD     BT/MEDQ  D:  06/17/2013  T:  06/18/2013  Job:  [id number]

## 2013-07-01 ENCOUNTER — Telehealth: Payer: Self-pay | Admitting: Internal Medicine

## 2013-07-01 MED ORDER — NYSTATIN 100000 UNIT/GM EX POWD: Freq: Four times a day (QID) | CUTANEOUS | Status: DC

## 2013-07-01 MED ORDER — FLUCONAZOLE 150 MG PO TABS: ORAL_TABLET | ORAL | Status: DC

## 2013-07-01 NOTE — Telephone Encounter (Addendum)
Debbie rn from [facility] has notice pt having a overwhelming vaginal  yeast inf. Please call in diflucan and nystatin powder call into [facility] [address]

## 2013-07-01 NOTE — Telephone Encounter (Signed)
Spoke to [place]am[place]bie who informed me that the pt's vulva is 3-4 times regular size.  Has redness that is very painful.  Debbie reported the pt has a yeast infection.  No fever and Debbie reports all other vitals are normal.  [facility] [address]

## 2013-07-15 ENCOUNTER — Encounter (HOSPITAL_BASED_OUTPATIENT_CLINIC_OR_DEPARTMENT_OTHER): Payer: Medicare Other | Attending: Plastic Surgery

## 2013-07-15 DIAGNOSIS — S98919A Complete traumatic amputation of unspecified foot, level unspecified, initial encounter: Secondary | ICD-10-CM | POA: Insufficient documentation

## 2013-07-15 DIAGNOSIS — L8990 Pressure ulcer of unspecified site, unspecified stage: Secondary | ICD-10-CM | POA: Insufficient documentation

## 2013-07-15 DIAGNOSIS — L89899 Pressure ulcer of other site, unspecified stage: Secondary | ICD-10-CM | POA: Insufficient documentation

## 2013-07-15 LAB — GLUCOSE, CAPILLARY: GLUCOSE-CAPILLARY: 179 mg/dL — AB (ref 70–99)

## 2013-07-16 NOTE — Progress Notes (Signed)
[facility]  NAME:  Natasha West, Natasha West               ACCOUNT NO.:  [id number]  MEDICAL RECORD NO.:  [id number]      DATE OF BIRTH:  1928-04-08  PHYSICIAN:  Glenna FellowsBrinda Alexismarie Flaim, MD    VISIT DATE:  07/15/2013                                  OFFICE VISIT   CHIEF COMPLAINT:  Bilateral feet ulcerations secondary to pressure.  HISTORY OF PRESENT ILLNESS:  The patient is an 80 year old nonambulatory female that is here for followup of pressure ulceration over her right heel as well as the left great toe.  She has a history of right transmetatarsal amputation with Dr. Luiz BlareGraves secondary to hammertoes and pressure ulcerations.  At her last visit here, we ordered air onlay mattress as well as offloading boots.  She presents today with her daughter who reports that her caregivers felt that it would be too difficult to move the patient from an air mattress and so they discontinued that order. They have obtained offloading boots for home. Her current wound care has been Promogran to the right heel with heel foam, hydrocolloid was ordered to the left great toe, however, she presents today with dry dressing alone.  On examination, blood pressure is 136/79, blood glucose is 179, pulse is 96, temperature is 97.8.  Left toe has now a Wagner 2 ulceration that is down to bone without evidence of infection.  Wound size measured at 0.4 x 0.2 x 0.1 cm. Curette and rongeur was used to debride the bone base to briskly bleeding tissue.  Over the right heel, she has had significant contraction with remaining open wound at 0.2 x 0.2 x 0.2 cm.  After application of topical anesthetic, curette was used to perform selective debridement over the entirety of this wound.    We will plan for collagen to both wounds followed by heel protective foam to the right heel and hydrocolloid to the great toe.  We will plan for monthly followup given the difficulty in transportation for the patient.  We reviewed  options for the great toe given the wound is now down to bone.  The family is aware that amputation may be a possibility given this is how she developed ulcerations over her opposite foot, however, in the absence of gross infection of the foot, I do not feel that this is an urgent need for amputation and we can continue to try to heal the wound secondarily. We did discuss hyperbaric oxygen as a treatment and they have declined any further workup for this.  We will plan for followup in 1 month's time.          ______________________________ Glenna FellowsBrinda Neda Willenbring, MD MBA     BT/MEDQ  D:  07/15/2013  T:  07/16/2013  Job:  [id number]

## 2013-08-12 ENCOUNTER — Encounter (HOSPITAL_BASED_OUTPATIENT_CLINIC_OR_DEPARTMENT_OTHER): Payer: Medicare Other | Attending: Plastic Surgery

## 2013-08-12 DIAGNOSIS — S98919A Complete traumatic amputation of unspecified foot, level unspecified, initial encounter: Secondary | ICD-10-CM | POA: Insufficient documentation

## 2013-08-12 DIAGNOSIS — L8990 Pressure ulcer of unspecified site, unspecified stage: Secondary | ICD-10-CM | POA: Insufficient documentation

## 2013-08-12 DIAGNOSIS — L89609 Pressure ulcer of unspecified heel, unspecified stage: Secondary | ICD-10-CM | POA: Insufficient documentation

## 2013-08-13 NOTE — Progress Notes (Signed)
[facility]  NAME:  Natasha West, Natasha West               ACCOUNT NO.:  [id number]  MEDICAL RECORD NO.:  [id number]      DATE OF BIRTH:  03/17/1928  PHYSICIAN:  Glenna FellowsBrinda Vernelle Wisner, MD         VISIT DATE:                                  OFFICE VISIT   CHIEF COMPLAINT:  The patient is here for bilateral feet ulcerations secondary to pressure.  HISTORY OF PRESENT ILLNESS:  The patient is an 80 year old nonambulatory female that is accompanied by her daughter for followup of pressure ulcerations over her right heel and her left great toe.  She has a history of right transmetatarsal amputation with Dr. Luiz BlareGraves secondary to hammertoes and pressure ulcerations. She does have a hammertoe over her left foot great toe that has led to this ulceration.  She does have protective boots that she uses over her right lower extremity. Currently, she is using only a bedroom slipper. Over her left foot, she is using a fracture sandal.  Current wound care has been Promogran with hydrocolloid ordered to the left great toe and foam heel protection over the right heel.  The patient's daughter reports that she transported the patient today and when she arrived, there were no dressings in place.  She has been in contact with the patient's home health nurse who states that they feel that all the wounds had healed and no further dressings have been applied recently.  The daughter would like to return to her protective boot over right lower extremity as she has developed secondary trauma over her distal leg.  She states these traumatic wounds are in part due to her father being legally blind and not being able to see how he  transports or adjusts the patient.  EXAMINATION:  Blood pressure is 144/80, pulse is 84, temperature is 97.7.  Left great toe had a history of Wagner 2 ulceration, it appears to be completely epithelialized at this time and she does have a hammertoe deformity on this side. Over the  right heel, there is remaining open wound that is 0.2 x 0.2 x 0.2 cm.  There is scant drainage present.  ASSESSMENT:  Healed left great toe.  Right heel pressure ulceration. Given this very small size of her right heel, I do not think the collagen can be effectively placed.  I also do not think that it has been placed recently as there is no remnant collagen present surrounding the wound.  In this setting., we will continue with the protective heel foam.  I have counseled the patient's daughter that I would hold off on the protective boot as I am not able to examine today and the additional dressings with the boots may provide secondary pressure points.  Over the left foot , the wound has healed.  However, I would continue to protect the area as it is high risk with hydrocolloid.  We will plan for recheck in 6 weeks' time.          ______________________________ Glenna FellowsBrinda Cali Cuartas, MD MBA     BT/MEDQ  D:  08/12/2013  T:  08/13/2013  Job:  [id number]

## 2013-08-20 ENCOUNTER — Telehealth: Payer: Self-pay | Admitting: Internal Medicine

## 2013-08-20 NOTE — Telephone Encounter (Signed)
Pt received powder for yeast infection today. Pt daughter is [facility] [address]

## 2013-08-22 MED ORDER — FLUCONAZOLE 150 MG PO TABS: 150.0000 mg | ORAL_TABLET | Freq: Once | ORAL | Status: DC

## 2013-08-22 NOTE — Telephone Encounter (Signed)
Spoke to [place].  Pt suffers from another vaginal yeast infection.  Home nurse reports that it is not as bad as the last time.  Daughter is concerned that it may become worse. Sent to the pharmacy by e-scribe.

## 2013-08-22 NOTE — Telephone Encounter (Signed)
Left a message at the below listed number for the Lawson FiscalLori to return my call.

## 2013-08-26 DIAGNOSIS — E1169 Type 2 diabetes mellitus with other specified complication: Secondary | ICD-10-CM

## 2013-08-26 DIAGNOSIS — M9080 Osteopathy in diseases classified elsewhere, unspecified site: Secondary | ICD-10-CM

## 2013-08-26 DIAGNOSIS — L97509 Non-pressure chronic ulcer of other part of unspecified foot with unspecified severity: Secondary | ICD-10-CM

## 2013-08-26 DIAGNOSIS — M8618 Other acute osteomyelitis, other site: Secondary | ICD-10-CM

## 2013-09-03 ENCOUNTER — Encounter: Payer: Medicare Other | Admitting: Family Medicine

## 2013-09-03 NOTE — Progress Notes (Signed)
Error   This encounter was created in error - please disregard. 

## 2013-09-26 ENCOUNTER — Telehealth: Payer: Self-pay | Admitting: Internal Medicine

## 2013-09-26 NOTE — Telephone Encounter (Signed)
Husband states [facility] called him today automated and advised pt to make appt w/ pcp. Pt has appt 7/17. Husband would like to know if this appt will be ok for  Pt or does pt need sooner appt?

## 2013-09-26 NOTE — Telephone Encounter (Signed)
This is fine 

## 2013-09-30 ENCOUNTER — Encounter (HOSPITAL_BASED_OUTPATIENT_CLINIC_OR_DEPARTMENT_OTHER): Payer: Medicare Other | Attending: Plastic Surgery

## 2013-09-30 DIAGNOSIS — L89899 Pressure ulcer of other site, unspecified stage: Secondary | ICD-10-CM | POA: Insufficient documentation

## 2013-09-30 DIAGNOSIS — S98919A Complete traumatic amputation of unspecified foot, level unspecified, initial encounter: Secondary | ICD-10-CM | POA: Insufficient documentation

## 2013-09-30 DIAGNOSIS — M2040 Other hammer toe(s) (acquired), unspecified foot: Secondary | ICD-10-CM | POA: Insufficient documentation

## 2013-09-30 DIAGNOSIS — L8990 Pressure ulcer of unspecified site, unspecified stage: Secondary | ICD-10-CM | POA: Insufficient documentation

## 2013-09-30 DIAGNOSIS — L89609 Pressure ulcer of unspecified heel, unspecified stage: Secondary | ICD-10-CM | POA: Insufficient documentation

## 2013-10-01 NOTE — Progress Notes (Signed)
[facility]  NAME:  Natasha West, [name]               ACCOUNT NO.:  [id number]  MEDICAL RECORD NO.:  [id number]      DATE OF BIRTH:  [birth year]  PHYSICIAN:  Glenna FellowsBrinda Thimmappa, MD    VISIT DATE:  09/30/2013                                  OFFICE VISIT   HISTORY OF PRESENT ILLNESS:  The patient is an 80 year old nonambulatory female who is here for followup of pressure ulceration of her right heel and left great toe.  At her last visit, she was noted to be nearly healed.  She has history of severe hammertoes that developed over her right lower extremity that led to a transmetatarsal amputation.  She has a similar hammertoe over left foot.  At her last visit, this has completely healed.  They report a recent trauma with bleeding to the left great toe, but feel that has subsequently healed.  She is accompanied by her daughter who reports that they would like to return her to normal shoes.  However, the daughter is going to have them inspected by the orthotist prior to placing them.  On examination, she appears to have completely healed both wounds. There is some adherent scab over the left great toe.  They are requesting trimming of her toenails as they are fearful that it will cause wounding.  This was completed in a limited fashion over her left great toe using scissors.  We will plan to discharge them from the clinic and welcome to return at any time as needed.          ______________________________ Glenna FellowsBrinda Thimmappa, MD     BT/MEDQ  D:  09/30/2013  T:  10/01/2013  Job:  [id number]

## 2013-10-02 NOTE — Telephone Encounter (Signed)
Pt aware.

## 2013-10-04 ENCOUNTER — Telehealth: Payer: Self-pay | Admitting: Internal Medicine

## 2013-10-04 DIAGNOSIS — Z0279 Encounter for issue of other medical certificate: Secondary | ICD-10-CM

## 2013-10-04 NOTE — Telephone Encounter (Signed)
Spoke to the pt's daughter over the telephone.  She helped to fill out paper work.  [facility]  Notified her that she will receive a phone call when it is ready to pick up.

## 2013-10-04 NOTE — Telephone Encounter (Signed)
Daughter wanting to know if paperwork is ready for pick up for pt, please call daughter at [phone].

## 2013-10-25 ENCOUNTER — Encounter: Payer: Self-pay | Admitting: Internal Medicine

## 2013-10-25 ENCOUNTER — Ambulatory Visit (INDEPENDENT_AMBULATORY_CARE_PROVIDER_SITE_OTHER): Payer: Medicare Other | Admitting: Internal Medicine

## 2013-10-25 VITALS — BP 136/72 | HR 86 | Temp 98.4°F

## 2013-10-25 DIAGNOSIS — F0390 Unspecified dementia without behavioral disturbance: Secondary | ICD-10-CM

## 2013-10-25 DIAGNOSIS — L089 Local infection of the skin and subcutaneous tissue, unspecified: Secondary | ICD-10-CM | POA: Insufficient documentation

## 2013-10-25 DIAGNOSIS — L97509 Non-pressure chronic ulcer of other part of unspecified foot with unspecified severity: Secondary | ICD-10-CM

## 2013-10-25 DIAGNOSIS — R6889 Other general symptoms and signs: Secondary | ICD-10-CM

## 2013-10-25 DIAGNOSIS — E1149 Type 2 diabetes mellitus with other diabetic neurological complication: Secondary | ICD-10-CM

## 2013-10-25 DIAGNOSIS — E1142 Type 2 diabetes mellitus with diabetic polyneuropathy: Secondary | ICD-10-CM

## 2013-10-25 DIAGNOSIS — Z899 Acquired absence of limb, unspecified: Secondary | ICD-10-CM

## 2013-10-25 LAB — HEPATIC FUNCTION PANEL
ALK PHOS: 71 U/L (ref 39–117)
ALT: 15 U/L (ref 0–35)
AST: 23 U/L (ref 0–37)
Albumin: 3.6 g/dL (ref 3.5–5.2)
Bilirubin, Direct: 0.2 mg/dL (ref 0.0–0.3)
Total Bilirubin: 0.9 mg/dL (ref 0.2–1.2)
Total Protein: 7.6 g/dL (ref 6.0–8.3)

## 2013-10-25 LAB — CBC WITH DIFFERENTIAL/PLATELET
BASOS ABS: 0 10*3/uL (ref 0.0–0.1)
Basophils Relative: 0.1 % (ref 0.0–3.0)
EOS ABS: 0.1 10*3/uL (ref 0.0–0.7)
Eosinophils Relative: 0.6 % (ref 0.0–5.0)
HCT: 43.4 % (ref 36.0–46.0)
Hemoglobin: 14.1 g/dL (ref 12.0–15.0)
LYMPHS PCT: 12.7 % (ref 12.0–46.0)
Lymphs Abs: 1.4 10*3/uL (ref 0.7–4.0)
MCHC: 32.5 g/dL (ref 30.0–36.0)
MCV: 87.9 fl (ref 78.0–100.0)
Monocytes Absolute: 0.5 10*3/uL (ref 0.1–1.0)
Monocytes Relative: 4.9 % (ref 3.0–12.0)
NEUTROS PCT: 81.7 % — AB (ref 43.0–77.0)
Neutro Abs: 9.1 10*3/uL — ABNORMAL HIGH (ref 1.4–7.7)
Platelets: 283 10*3/uL (ref 150.0–400.0)
RBC: 4.94 Mil/uL (ref 3.87–5.11)
RDW: 15.3 % (ref 11.5–15.5)
WBC: 11.2 10*3/uL — ABNORMAL HIGH (ref 4.0–10.5)

## 2013-10-25 LAB — BASIC METABOLIC PANEL
BUN: 13 mg/dL (ref 6–23)
CHLORIDE: 105 meq/L (ref 96–112)
CO2: 28 mEq/L (ref 19–32)
CREATININE: 0.7 mg/dL (ref 0.4–1.2)
Calcium: 9.9 mg/dL (ref 8.4–10.5)
GFR: 81.98 mL/min (ref >60.00)
Glucose, Bld: 162 mg/dL — ABNORMAL HIGH (ref 70–99)
POTASSIUM: 3.4 meq/L — AB (ref 3.5–5.1)
Sodium: 142 mEq/L (ref 135–145)

## 2013-10-25 LAB — HEMOGLOBIN A1C: HEMOGLOBIN A1C: 7.3 % — AB (ref 4.6–6.5)

## 2013-10-25 MED ORDER — CEPHALEXIN 500 MG PO CAPS: 500.0000 mg | ORAL_CAPSULE | Freq: Four times a day (QID) | ORAL | Status: DC

## 2013-10-25 NOTE — Progress Notes (Signed)
Pre visit review using our clinic review tool, if applicable. No additional management support is needed unless otherwise documented below in the visit note.  Chief Complaint  Patient presents with  . Follow-up    dm foot ulcer dementia etc    HPI: Natasha West  comes in today for follow up of  multiple medical problems.  Here with daughter . [place] [facility] [place]   She is now not selef feeding or walking much or standing?  Got out of wound care last month now toe red this last few days .  No daily care right now.  But caretakers check feet. ? bp ? Ok   Dm ? No lows   ROS: See pertinent positives and negatives per HPI. No cp sob  No vomiting bleeding obv pain  Past Medical History  Diagnosis Date  . Diabetes mellitus   . Hyperlipidemia   . Hypertension   . Peripheral neuropathy     partial amputation rt foot  . Memory difficulty   . Foot infection     with sepsis  and amputation toes right  . Open wound of right foot 09/09/2011  . LOWER LIMB AMPUTATION, OTHER TOE 05/07/2008    Qualifier: Diagnosis of  By: Fabian Sharp MD, Neta Mends   . Lower limb amputation, great toe 06/30/2008    Qualifier: Diagnosis of  By: Fabian Sharp MD, Neta Mends     Family History  Problem Relation Age of Onset  . Alzheimer's disease Mother   . Other Father     cva endarterectomy  . Diabetes type II Daughter     History   Social History  . Marital Status: Married    Spouse Name: N/A    Number of Children: N/A  . Years of Education: N/A   Social History Main Topics  . Smoking status: Former Smoker    Quit date: 09/07/1991  . Smokeless tobacco: Never Used  . Alcohol Use: No  . Drug Use: No  . Sexual Activity: None   Other Topics Concern  . None   Social History Narrative   Married    Husband has vision problem and recurrent falls   Moved to retirement place   [place] 2010 closer to family   G4P3   Currently back to [place] after [employer]  is prepared for them. 2 daughters checking on them   1 help her who comes in to help bathe and dress patient    Outpatient Encounter Prescriptions as of 10/25/2013  Medication Sig  . Ascorbic Acid (VITAMIN C) 100 MG tablet Take 100 mg by mouth daily.    [name] aspirin 81 MG chewable tablet Chew 81 mg by mouth daily.    [name] atorvastatin (LIPITOR) 10 MG tablet Take 1 tablet (10 mg total) by mouth daily.  . cholecalciferol (VITAMIN D) 1000 UNITS tablet Take 1,000 Units by mouth daily.  [name] donepezil (ARICEPT ODT) 10 MG disintegrating tablet Take 1 tablet (10 mg total) by mouth at bedtime.  [name] lisinopril (PRINIVIL,ZESTRIL) 5 MG tablet Take 1 tablet (5 mg total) by mouth daily.  . metFORMIN (GLUCOPHAGE-XR) 500 MG 24 hr tablet Take 1 tablet (500 mg total) by mouth 2 (two) times daily.  . multivitamin (THERAGRAN) per tablet Take 1 tablet by mouth daily.    . vitamin B-12 (CYANOCOBALAMIN) 100 MCG tablet Take 500 mcg by mouth daily. 1000 daily   . cephALEXin (KEFLEX) 500 MG capsule  Take 1 capsule (500 mg total) by mouth 4 (four) times daily.  . [DISCONTINUED] calcium-vitamin D (OSCAL WITH D) 250-125 MG-UNIT per tablet Take 1 tablet by mouth daily.  . [DISCONTINUED] doxycycline (VIBRA-TABS) 100 MG tablet Take 1 tablet (100 mg total) by mouth 2 (two) times daily.  . [DISCONTINUED] fluconazole (DIFLUCAN) 150 MG tablet Take 1 tablet and may repeat in 3 days if needed  . [DISCONTINUED] fluconazole (DIFLUCAN) 150 MG tablet Take 1 tablet (150 mg total) by mouth once. May repeat in 3 days if needed.  . [DISCONTINUED] Nutritional Supplements (JUICE PLUS FIBRE PO) Take by mouth.  . [DISCONTINUED] nystatin (MYCOSTATIN) powder Apply topically 4 (four) times daily.    EXAM:  BP 136/72  Pulse 86  Temp(Src) 98.4 F (36.9 C) (Oral)  SpO2 96%  There is no weight on file to calculate BMI.  GENERAL: vitals reviewed and listed above, alert, oriented, appears well hydrated and in no acute distressing wc minimal verbally  doesn't recognize me name of daughter  Difficult  cooperative  HEENT: atraumatic, conjunctiva  clear, no obvious abnormalities on inspection of external nose and earsNECK: no obvious masses on inspection palpation  LUNGS: clear to auscultation bilaterally, no wheezes, rales or rhonchi,  CV: HRRR, no clubbing cyanosis or  peripheral edema nl cap refill  MS: moves all extremities  Feet right partial amp redness no ulcer heel mid foot . Left great toe 2+ edema rednss and blister noted  Redness to distal foot    Lab Results  Component Value Date   WBC 11.2* 10/25/2013   HGB 14.1 10/25/2013   HCT 43.4 10/25/2013   PLT 283.0 10/25/2013   GLUCOSE 162* 10/25/2013   CHOL 140 04/26/2013   TRIG 169.0* 04/26/2013   HDL 51.30 04/26/2013   LDLDIRECT 77.8 09/20/2006   LDLCALC 55 04/26/2013   ALT 15 10/25/2013   AST 23 10/25/2013   NA 142 10/25/2013   K 3.4* 10/25/2013   CL 105 10/25/2013   CREATININE 0.7 10/25/2013   BUN 13 10/25/2013   CO2 28 10/25/2013   TSH 0.79 04/26/2013   HGBA1C 7.3* 10/25/2013   MICROALBUR 1.9 05/19/2011     ASSESSMENT AND PLAN:  Discussed the following assessment and plan:  Type II or unspecified type diabetes mellitus with neurological manifestations, not stated as uncontrolled - due for monitoring  - Plan: CBC with Differential, Basic metabolic panel, Hemoglobin A1c, Hepatic function panel  Ulcer of other part of foot - left great toe  infected apparently new over days begin antibiotic and wound clinicn aft on monday  or as needed . seek emergen care if worse in interim - Plan: CBC with Differential, Basic metabolic panel, Hemoglobin A1c, Hepatic function panel  Dementia, without behavioral disturbance - declining consdier hospice eval - Plan: CBC with Differential, Basic metabolic panel, Hemoglobin A1c, Hepatic function panel  Toe infection - Plan: CBC with Differential, Basic metabolic panel, Hemoglobin A1c, Hepatic function panel  Polyneuropathy in diabetes(357.2)  Hx of  amputation Lab today to meed criteria  And also r/o metabolic   appt. for wound  Evaluation Monday 130 wound clinic seek emergent care if fever chills spreading infection etc.  Higher level care needed  hom e health and hospice referral.  -Patient advised to return or notify health care team  if symptoms worsen ,persist or new concerns arise.  Patient Instructions  Begin antibiotic but needs to see either dr Luiz BlareGraves or wound clininc about the left toe . Will initiate a hospice  referral  Because Ms Papadakis  Decline and no longer feeding self.   Due for diabetes and blood check today .  check today .     Neta Mends. Montzerrat Brunell M.D.  Total visit > 50% spent counseling and coordinating care

## 2013-10-25 NOTE — Patient Instructions (Addendum)
Begin antibiotic but needs to see either dr Luiz BlareGraves or wound clininc about the left toe . Will initiate a hospice referral  Because Natasha West  Decline and no longer feeding self.   Due for diabetes and blood check today .  check today .

## 2013-10-28 ENCOUNTER — Encounter (HOSPITAL_BASED_OUTPATIENT_CLINIC_OR_DEPARTMENT_OTHER): Payer: Medicare Other | Attending: Plastic Surgery

## 2013-10-28 DIAGNOSIS — M2040 Other hammer toe(s) (acquired), unspecified foot: Secondary | ICD-10-CM | POA: Insufficient documentation

## 2013-10-28 DIAGNOSIS — L97809 Non-pressure chronic ulcer of other part of unspecified lower leg with unspecified severity: Secondary | ICD-10-CM | POA: Insufficient documentation

## 2013-10-28 DIAGNOSIS — S98919A Complete traumatic amputation of unspecified foot, level unspecified, initial encounter: Secondary | ICD-10-CM | POA: Insufficient documentation

## 2013-10-29 ENCOUNTER — Other Ambulatory Visit: Payer: Self-pay | Admitting: Family Medicine

## 2013-10-29 DIAGNOSIS — E119 Type 2 diabetes mellitus without complications: Secondary | ICD-10-CM

## 2013-10-29 DIAGNOSIS — F0391 Unspecified dementia with behavioral disturbance: Secondary | ICD-10-CM

## 2013-10-30 ENCOUNTER — Telehealth: Payer: Self-pay | Admitting: Internal Medicine

## 2013-10-30 NOTE — Telephone Encounter (Signed)
Patient Information:  Caller Name: Melinda  Phone: [phone]  Patient: Natasha West, Natasha West  Gender: Female  DOB: [birth year]  Age: 80 Years  PCP: Berniece AndreasPanosh, [facility]  Office Follow Up:  Does the office need to follow up with this patient?: Yes  Instructions For The Office: Please contact daughter regarding Hospice referral.  Encouraged to keep a diet log and blood sugar log. Daughter does not know what foods were given or previous blood sugar by aides.  PLEASE CONTACT REGARDING THE REFERRAL  RN Note:  Please contact daughter regarding Hospice referral.  Encouraged to keep a diet log and blood sugar log. Daughter does not know what foods were given or previous blood sugar by aides.  PLEASE CONTACT REGARDING THE REFERRAL  Symptoms  Reason For Call & Symptoms: Daughter states that her mother blood sugar was greater >300.  She does an infection on her feet.  she is currently on antibiotic since last Thursday.  It is now down #270   (2) Has not heard from the office about Hospice Care   (3)  Sore on Left foot. She is currently on antibiotics. Forever Sabino GasserYoung Aide is taking care of foot and mentioned color change to daughter.  Daughter states with elevating foot color looks better than hanging dependent.   Daughter states Metformin was taken this morning. She does  not know what sugar normally runs. Unsure of food intake. Afebrile.  Denies pain in foot. No redness , warmth or drainage noted from foot.  Daughter has not seen wound. Taken care of by Aides  Reviewed Health History In EMR: Yes  Reviewed Medications In EMR: Yes  Reviewed Allergies In EMR: Yes  Reviewed Surgeries / Procedures: Yes  Date of Onset of Symptoms: 10/30/2013  Guideline(s) Used:  Diabetes - High Blood Sugar  Foot Pain  Disposition Per Guideline:   Home Care  Reason For Disposition Reached:   Foot pain  Advice Given:  Treatment - Liquids  Drink at least one glass (8 oz or 240 ml) of water per hour for the next  4 hours. (Reason: adequate hydration will reduce hyperglycemia).  Generally, you should try to drink 6-8 glasses of water each day.  Treatment - Diabetes Medications  : Continue taking your diabetes pills.  Daily Blood Glucose Goals  Pre-prandial (before meal): 70-130 mg/dL (0.9-8.13.9-7.2 mmol/l)  Post-prandial (2-3 hours after a meal): Less than 180 mg/dL (10 mmol/l)  Measure and Record Your Blood Glucose  Every day you should measure your blood glucose before breakfast and before going to bed.  Record the results and show them to your doctor at your next office visit.  Call Back If:  Blood glucose more than 300 mg/dL (19.116.5 mmol/l), 2 or more times in a row.  Vomiting lasting more than 4 hours or unable to drink any liquids.  Rapid breathing occurs  You become worse.  Aggravating Factors   Aging: Foot pain is more common in the elderly. During the aging process, the feet widen and flatten, and the skin becomes drier.  Shoes: Poorly fitting or overly tight shoes are the underlying cause of many painful foot conditions. High heels are bad for feet.  Call Back If:  Swelling, redness, or fever occur  You become worse.  Patient Will Follow Care Advice:  YES

## 2013-10-30 NOTE — Telephone Encounter (Signed)
I spoke with Juliette AlcideMelinda - 1. There is a hospice meeting on Monday                                      2. They usually check glucose every morning but have been checking more often.                                     3.  Patient's glucose is now at 208                                     4.  Patient is not drinking or eating well    5.  There is Novolog 70/30 at home (dads) Per Dr Fabian SharpPanosh patient should have her glucose checked tonight and in the morning and call the office with glucose results.

## 2013-10-31 NOTE — Telephone Encounter (Signed)
Left a message for Natasha West to return my call.

## 2013-10-31 NOTE — Telephone Encounter (Signed)
Spoke to [place], she stated she asked the caregivers to keep a record of the patient's blood sugars today but she does not know what the reading are.  She will call to find out and will then call me back.

## 2013-11-04 DIAGNOSIS — L97809 Non-pressure chronic ulcer of other part of unspecified lower leg with unspecified severity: Secondary | ICD-10-CM | POA: Diagnosis present

## 2013-11-04 DIAGNOSIS — S98919A Complete traumatic amputation of unspecified foot, level unspecified, initial encounter: Secondary | ICD-10-CM | POA: Diagnosis not present

## 2013-11-04 DIAGNOSIS — M2040 Other hammer toe(s) (acquired), unspecified foot: Secondary | ICD-10-CM | POA: Diagnosis not present

## 2013-11-04 NOTE — Telephone Encounter (Signed)
Blood sugar 162 this morning and down from 300 on Wednesday of last week.  Slowly going down everyday.

## 2013-11-04 NOTE — Telephone Encounter (Signed)
Continue checking and call us before weekend about readings.

## 2013-11-05 NOTE — Progress Notes (Signed)
[facility]  NAME:  Natasha West, Natasha West               ACCOUNT NO.:  [id number]  MEDICAL RECORD NO.:  [id number]      DATE OF BIRTH:  08-29-1927  PHYSICIAN:  Glenna FellowsBrinda Murl Golladay, MD    VISIT DATE:  11/04/2013                                  OFFICE VISIT   CHIEF COMPLAINT:  Recurrent right great toe ulceration.  HISTORY OF PRESENT ILLNESS:  The patient is an 80 year old nonambulatory female, who presents for recurrent ulceration over her dorsum of her right great toe.  She has a history of severe hammertoes over the right lower extremity.  These did lead to recurrent wounds and an eventual transmetatarsal amputation by Dr. Luiz BlareGraves.  She has a similar hammertoe of the left foot.  They note that for the last week, she has had recurrent ulcer over this toe after it completely healed in June 2015. She is accompanied by her daughter who reports that she will be transitioning to a skilled nursing facility in [place] at the end of the week.  PHYSICAL EXAMINATION:  VITAL SIGNS:  Blood pressure is 121/77, pulse is 107, blood glucose is 160, temperature is 98.5. EXTREMITIES:  Over the left great toe, there is recurrent Wagner 2 ulceration measured as 1.1 x 1.5 x 0.1 as a cluster.  There appears to be maceration of the surrounding skin.  However, no real cellulitis.  No debridement was performed.  ASSESSMENT:  The daughter reports that she has been started on antibiotics for treatment of her toe.  I counseled that I do not see any gross signs of infection today but she should complete her course of antibiotics.  The daughter expresses concern that they have had recurrent wounds that they have been treating for over a year's time and feel that she should proceed with amputation of her hammertoes over the left foot to prevent recurrent ulceration.  Given that the patient healed well from her right transmetatarsal amputation and has had no further issues over this foot, it is  reasonable to pursue this option. The daughter will contact Dr. Stacy GardnerGraves's office for followup visit as the patient will be transitioning to the nursing facility.  We gave copies of the current wound care orders for collagen to be completed 3 times weekly with foam dressing.  She has continued to use her postop fracture shoe on this side.  We will not plan any followup At this time as the family would like to pursue amputation.  They will contact us if they have any needs that we can help with.          ______________________________ Glenna FellowsBrinda Aadhav Uhlig, MD MBA     BT/MEDQ  D:  11/04/2013  T:  11/05/2013  Job:  [id number]

## 2013-11-06 NOTE — Telephone Encounter (Signed)
Left detailed message on machine for Natasha West to continue readings and call back with a report

## 2013-11-08 ENCOUNTER — Telehealth: Payer: Self-pay | Admitting: Internal Medicine

## 2013-11-08 NOTE — Telephone Encounter (Signed)
FL2 completed and gave to the daughter.

## 2013-11-08 NOTE — Telephone Encounter (Signed)
Per daughter pt is packed and ready to be moved in to nursing facility tday , however, the FL-2 that was faxed to our office has not been received.  Please call daughter back asap with status of completed FL-2, she lives nearby and can pick it up at any time today.

## 2013-11-15 DIAGNOSIS — L97409 Non-pressure chronic ulcer of unspecified heel and midfoot with unspecified severity: Secondary | ICD-10-CM

## 2013-11-15 DIAGNOSIS — E1142 Type 2 diabetes mellitus with diabetic polyneuropathy: Secondary | ICD-10-CM

## 2013-11-15 DIAGNOSIS — L97509 Non-pressure chronic ulcer of other part of unspecified foot with unspecified severity: Secondary | ICD-10-CM

## 2013-11-15 DIAGNOSIS — E1149 Type 2 diabetes mellitus with other diabetic neurological complication: Secondary | ICD-10-CM

## 2013-11-15 DIAGNOSIS — E1169 Type 2 diabetes mellitus with other specified complication: Secondary | ICD-10-CM

## 2014-02-26 ENCOUNTER — Encounter: Payer: Self-pay | Admitting: Neurology

## 2014-03-04 ENCOUNTER — Encounter: Payer: Self-pay | Admitting: Neurology

## 2015-05-08 ENCOUNTER — Emergency Department (HOSPITAL_COMMUNITY): Payer: Medicare Other

## 2015-05-08 ENCOUNTER — Emergency Department (HOSPITAL_COMMUNITY)
Admission: EM | Admit: 2015-05-08 | Discharge: 2015-05-08 | Disposition: A | Discharge status: Home / Self Care | Payer: Medicare Other | Attending: Emergency Medicine | Admitting: Emergency Medicine

## 2015-05-08 ENCOUNTER — Encounter (HOSPITAL_COMMUNITY): Payer: Self-pay | Admitting: Emergency Medicine

## 2015-05-08 DIAGNOSIS — Y92128 Other place in nursing home as the place of occurrence of the external cause: Secondary | ICD-10-CM | POA: Insufficient documentation

## 2015-05-08 DIAGNOSIS — E785 Hyperlipidemia, unspecified: Secondary | ICD-10-CM | POA: Insufficient documentation

## 2015-05-08 DIAGNOSIS — Z89419 Acquired absence of unspecified great toe: Secondary | ICD-10-CM | POA: Diagnosis not present

## 2015-05-08 DIAGNOSIS — Z23 Encounter for immunization: Secondary | ICD-10-CM | POA: Diagnosis not present

## 2015-05-08 DIAGNOSIS — S29001A Unspecified injury of muscle and tendon of front wall of thorax, initial encounter: Secondary | ICD-10-CM | POA: Insufficient documentation

## 2015-05-08 DIAGNOSIS — Z89429 Acquired absence of other toe(s), unspecified side: Secondary | ICD-10-CM | POA: Insufficient documentation

## 2015-05-08 DIAGNOSIS — E119 Type 2 diabetes mellitus without complications: Secondary | ICD-10-CM | POA: Diagnosis not present

## 2015-05-08 DIAGNOSIS — Z792 Long term (current) use of antibiotics: Secondary | ICD-10-CM | POA: Insufficient documentation

## 2015-05-08 DIAGNOSIS — S0093XA Contusion of unspecified part of head, initial encounter: Secondary | ICD-10-CM

## 2015-05-08 DIAGNOSIS — W01198A Fall on same level from slipping, tripping and stumbling with subsequent striking against other object, initial encounter: Secondary | ICD-10-CM | POA: Diagnosis not present

## 2015-05-08 DIAGNOSIS — Z7982 Long term (current) use of aspirin: Secondary | ICD-10-CM | POA: Diagnosis not present

## 2015-05-08 DIAGNOSIS — S0083XA Contusion of other part of head, initial encounter: Secondary | ICD-10-CM | POA: Diagnosis not present

## 2015-05-08 DIAGNOSIS — Z8669 Personal history of other diseases of the nervous system and sense organs: Secondary | ICD-10-CM | POA: Diagnosis not present

## 2015-05-08 DIAGNOSIS — Y9389 Activity, other specified: Secondary | ICD-10-CM | POA: Insufficient documentation

## 2015-05-08 DIAGNOSIS — Z880 Allergy status to penicillin: Secondary | ICD-10-CM | POA: Diagnosis not present

## 2015-05-08 DIAGNOSIS — Z79899 Other long term (current) drug therapy: Secondary | ICD-10-CM | POA: Diagnosis not present

## 2015-05-08 DIAGNOSIS — I10 Essential (primary) hypertension: Secondary | ICD-10-CM | POA: Insufficient documentation

## 2015-05-08 DIAGNOSIS — S0990XA Unspecified injury of head, initial encounter: Secondary | ICD-10-CM | POA: Diagnosis present

## 2015-05-08 DIAGNOSIS — S80811S Abrasion, right lower leg, sequela: Secondary | ICD-10-CM | POA: Diagnosis not present

## 2015-05-08 DIAGNOSIS — Z87891 Personal history of nicotine dependence: Secondary | ICD-10-CM | POA: Diagnosis not present

## 2015-05-08 DIAGNOSIS — Z7984 Long term (current) use of oral hypoglycemic drugs: Secondary | ICD-10-CM | POA: Diagnosis not present

## 2015-05-08 DIAGNOSIS — Y998 Other external cause status: Secondary | ICD-10-CM | POA: Insufficient documentation

## 2015-05-08 DIAGNOSIS — R1110 Vomiting, unspecified: Secondary | ICD-10-CM | POA: Diagnosis not present

## 2015-05-08 DIAGNOSIS — Z872 Personal history of diseases of the skin and subcutaneous tissue: Secondary | ICD-10-CM | POA: Diagnosis not present

## 2015-05-08 DIAGNOSIS — F0390 Unspecified dementia without behavioral disturbance: Secondary | ICD-10-CM | POA: Diagnosis not present

## 2015-05-08 DIAGNOSIS — W19XXXA Unspecified fall, initial encounter: Secondary | ICD-10-CM

## 2015-05-08 MED ORDER — TETANUS-DIPHTH-ACELL PERTUSSIS 5-2.5-18.5 LF-MCG/0.5 IM SUSP
0.5000 mL | Freq: Once | INTRAMUSCULAR | Status: AC
  Administered 2015-05-08: 0.5 mL via INTRAMUSCULAR
  Filled 2015-05-08: qty 0.5

## 2015-05-08 NOTE — ED Notes (Signed)
[facility] arrived to transport patient. 

## 2015-05-08 NOTE — Discharge Instructions (Signed)
Contusion °A contusion is a deep bruise. Contusions are the result of a blunt injury to tissues and muscle fibers under the skin. The injury causes bleeding under the skin. The skin overlying the contusion may turn blue, purple, or yellow. Minor injuries will give you a painless contusion, but more severe contusions may stay painful and swollen for a few weeks.  °CAUSES  °This condition is usually caused by a blow, trauma, or direct force to an area of the body. °SYMPTOMS  °Symptoms of this condition include: °· Swelling of the injured area. °· Pain and tenderness in the injured area. °· Discoloration. The area may have redness and then turn blue, purple, or yellow. °DIAGNOSIS  °This condition is diagnosed based on a physical exam and medical history. An X-ray, CT scan, or MRI may be needed to determine if there are any associated injuries, such as broken bones (fractures). °TREATMENT  °Specific treatment for this condition depends on what area of the body was injured. In general, the best treatment for a contusion is resting, icing, applying pressure to (compression), and elevating the injured area. This is often called the RICE strategy. Over-the-counter anti-inflammatory medicines may also be recommended for pain control.  °HOME CARE INSTRUCTIONS  °· Rest the injured area. °· If directed, apply ice to the injured area: °¨ Put ice in a plastic bag. °¨ Place a towel between your skin and the bag. °¨ Leave the ice on for 20 minutes, 2-3 times per day. °· If directed, apply light compression to the injured area using an elastic bandage. Make sure the bandage is not wrapped too tightly. Remove and reapply the bandage as directed by your health care provider. °· If possible, raise (elevate) the injured area above the level of your heart while you are sitting or lying down. °· Take over-the-counter and prescription medicines only as told by your health care provider. °SEEK MEDICAL CARE IF: °· Your symptoms do not  improve after several days of treatment. °· Your symptoms get worse. °· You have difficulty moving the injured area. °SEEK IMMEDIATE MEDICAL CARE IF:  °· You have severe pain. °· You have numbness in a hand or foot. °· Your hand or foot turns pale or cold. °  °This information is not intended to replace advice given to you by your health care provider. Make sure you discuss any questions you have with your health care provider. °  °Document Released: 01/05/2005 Document Revised: 12/17/2014 Document Reviewed: 08/13/2014 °Elsevier Interactive Patient Education ©2016 Elsevier Inc. ° °Facial or Scalp Contusion °A facial or scalp contusion is a deep bruise on the face or head. Injuries to the face and head generally cause a lot of swelling, especially around the eyes. Contusions are the result of an injury that caused bleeding under the skin. The contusion may turn blue, purple, or yellow. Minor injuries will give you a painless contusion, but more severe contusions may stay painful and swollen for a few weeks.  °CAUSES  °A facial or scalp contusion is caused by a blunt injury or trauma to the face or head area.  °SIGNS AND SYMPTOMS  °· Swelling of the injured area.   °· Discoloration of the injured area.   °· Tenderness, soreness, or pain in the injured area.   °DIAGNOSIS  °The diagnosis can be made by taking a medical history and doing a physical exam. An X-ray exam, CT scan, or MRI may be needed to determine if there are any associated injuries, such as broken bones (fractures). °  TREATMENT  Often, the best treatment for a facial or scalp contusion is applying cold compresses to the injured area. Over-the-counter medicines may also be recommended for pain control.  HOME CARE INSTRUCTIONS   Only take over-the-counter or prescription medicines as directed by your health care provider.   Apply ice to the injured area.   Put ice in a plastic bag.   Place a towel between your skin and the bag.   Leave the  ice on for 20 minutes, 2-3 times a day.  SEEK MEDICAL CARE IF:  You have bite problems.   You have pain with chewing.   You are concerned about facial defects. SEEK IMMEDIATE MEDICAL CARE IF:  You have severe pain or a headache that is not relieved by medicine.   You have unusual sleepiness, confusion, or personality changes.   You throw up (vomit).   You have a persistent nosebleed.   You have double vision or blurred vision.   You have fluid drainage from your nose or ear.   You have difficulty walking or using your arms or legs.  MAKE SURE YOU:   Understand these instructions.  Will watch your condition.  Will get help right away if you are not doing well or get worse.   This information is not intended to replace advice given to you by your health care provider. Make sure you discuss any questions you have with your health care provider.   Document Released: 05/05/2004 Document Revised: 04/18/2014 Document Reviewed: 11/08/2012 Elsevier Interactive Patient Education 2016 Elsevier Inc. Abrasion An abrasion is a cut or scrape on the outer surface of your skin. An abrasion does not extend through all of the layers of your skin. It is important to care for your abrasion properly to prevent infection. CAUSES Most abrasions are caused by falling on or gliding across the ground or another surface. When your skin rubs on something, the outer and inner layer of skin rubs off.  SYMPTOMS A cut or scrape is the main symptom of this condition. The scrape may be bleeding, or it may appear red or pink. If there was an associated fall, there may be an underlying bruise. DIAGNOSIS An abrasion is diagnosed with a physical exam. TREATMENT Treatment for this condition depends on how large and deep the abrasion is. Usually, your abrasion will be cleaned with water and mild soap. This removes any dirt or debris that may be stuck. An antibiotic ointment may be applied to the  abrasion to help prevent infection. A bandage (dressing) may be placed on the abrasion to keep it clean. You may also need a tetanus shot. HOME CARE INSTRUCTIONS Medicines  Take or apply medicines only as directed by your health care provider.  If you were prescribed an antibiotic ointment, finish all of it even if you start to feel better. Wound Care  Clean the wound with mild soap and water 2-3 times per day or as directed by your health care provider. Pat your wound dry with a clean towel. Do not rub it.  There are many different ways to close and cover a wound. Follow instructions from your health care provider about:  Wound care.  Dressing changes and removal.  Check your wound every day for signs of infection. Watch for:  Redness, swelling, or pain.  Fluid, blood, or pus. General Instructions  Keep the dressing dry as directed by your health care provider. Do not take baths, swim, use a hot tub, or do anything that  would put your wound underwater until your health care provider approves.  If there is swelling, raise (elevate) the injured area above the level of your heart while you are sitting or lying down.  Keep all follow-up visits as directed by your health care provider. This is important. SEEK MEDICAL CARE IF:  You received a tetanus shot and you have swelling, severe pain, redness, or bleeding at the injection site.  Your pain is not controlled with medicine.  You have increased redness, swelling, or pain at the site of your wound. SEEK IMMEDIATE MEDICAL CARE IF:  You have a red streak going away from your wound.  You have a fever.  You have fluid, blood, or pus coming from your wound.  You notice a bad smell coming from your wound or your dressing.   This information is not intended to replace advice given to you by your health care provider. Make sure you discuss any questions you have with your health care provider.   Document Released: 01/05/2005  Document Revised: 12/17/2014 Document Reviewed: 03/26/2014 Elsevier Interactive Patient Education [employer]

## 2015-05-08 NOTE — ED Notes (Signed)
Report called to Joy at [facility]. She is aware of treatment in ED and is aware that [facility] will be delayed in transporting PT to facility

## 2015-05-08 NOTE — ED Provider Notes (Signed)
CSN: [id number]     Arrival date & time 05/08/15  1742 History   First MD Initiated Contact with Patient 05/08/15 1744     Chief Complaint  Patient presents with  . Fall     (Consider location/radiation/quality/duration/timing/severity/associated sxs/prior Treatment) Patient is a 82 y.o. female presenting with fall. The history is provided by the patient. No language interpreter was used.  Fall This is a new problem. The current episode started today. The problem occurs constantly. The problem has been gradually worsening. Associated symptoms include vomiting. Pertinent negatives include no weakness. Nothing aggravates the symptoms. She has tried nothing for the symptoms. The treatment provided no relief.   patient is a resident at a nursing home she was being transferred using a lift  and fell out of the lift. Patient struck the back of her head. EMS reports patient seem to be sore in her anterior chest. Patient has a swollen area to her left knee she has a superficial abrasion to her lower leg right. Patient has a history of Alzheimer's. EMS reports patient did not lose consciousness and reported that she is at her mental baseline. Level V caveat patient is unable to give history of incidental herself due to Alzheimer's.  Past Medical History  Diagnosis Date  . Diabetes mellitus   . Hyperlipidemia   . Hypertension   . Peripheral neuropathy (HCC)     partial amputation rt foot  . Memory difficulty   . Foot infection     with sepsis  and amputation toes right  . Open wound of right foot 09/09/2011  . LOWER LIMB AMPUTATION, OTHER TOE 05/07/2008    Qualifier: Diagnosis of  By: Fabian Sharp MD, Neta Mends   . Lower limb amputation, great toe (HCC) 06/30/2008    Qualifier: Diagnosis of  By: Fabian Sharp MD, Neta Mends    Past Surgical History  Procedure Laterality Date  . Total knee arthroplasty    . Elbow fracture surgery      left with orif  . Amputation  2009    from gangrene rt 4th toe Regal  .  Amputation  03 15 2010    Rt great toe and rt distal phalanx  . Toe amputation  1 12    right 3rd for ulcer  Graves   . Right amputation  7 12     with sepsis  . Appendectomy    . Amputation  09/09/2011    Procedure: AMPUTATION DIGIT;  Surgeon: Harvie Junior, MD;  Location: [facility];  Service: Orthopedics;  Laterality: Right;  revision amputation midfoot   Family History  Problem Relation Age of Onset  . Alzheimer's disease Mother   . Other Father     cva endarterectomy  . Diabetes type II Daughter    Social History  Substance Use Topics  . Smoking status: Former Smoker    Quit date: 09/07/1991  . Smokeless tobacco: Never Used  . Alcohol Use: No   OB History    No data available     Review of Systems  Unable to perform ROS: Dementia  Gastrointestinal: Positive for vomiting.  Neurological: Negative for weakness.  All other systems reviewed and are negative.     Allergies  Penicillins  Home Medications   Prior to Admission medications   Medication Sig Start Date End Date Taking? Authorizing Provider  Ascorbic Acid (VITAMIN C) 100 MG tablet Take 100 mg by mouth daily.      Historical Provider, MD  aspirin  81 MG chewable tablet Chew 81 mg by mouth daily.      Historical Provider, MD  atorvastatin (LIPITOR) 10 MG tablet Take 1 tablet (10 mg total) by mouth daily. 04/26/13   Madelin Headings, MD  cephALEXin (KEFLEX) 500 MG capsule Take 1 capsule (500 mg total) by mouth 4 (four) times daily. 10/25/13   Madelin Headings, MD  cholecalciferol (VITAMIN D) 1000 UNITS tablet Take 1,000 Units by mouth daily.    Historical Provider, MD  donepezil (ARICEPT ODT) 10 MG disintegrating tablet Take 1 tablet (10 mg total) by mouth at bedtime. 06/07/13   Madelin Headings, MD  lisinopril (PRINIVIL,ZESTRIL) 5 MG tablet Take 1 tablet (5 mg total) by mouth daily. 04/26/13   Madelin Headings, MD  metFORMIN (GLUCOPHAGE-XR) 500 MG 24 hr tablet Take 1 tablet (500 mg total) by mouth 2 (two)  times daily. 04/26/13   Madelin Headings, MD  multivitamin [facility]) per tablet Take 1 tablet by mouth daily.      Historical Provider, MD  vitamin B-12 (CYANOCOBALAMIN) 100 MCG tablet Take 500 mcg by mouth daily. 1000 daily     Historical Provider, MD   BP 109/65 mmHg  Pulse 112  Temp(Src) 97.7 F (36.5 C) (Oral)  Resp 16  SpO2 90% Physical Exam  Constitutional: She appears well-developed and well-nourished.  Swollen area scalp tnder to palp  HENT:  Head: Normocephalic.  Right Ear: External ear normal.  Left Ear: External ear normal.  Nose: Nose normal.  Mouth/Throat: Oropharynx is clear and moist.  Eyes: Conjunctivae and EOM are normal. Pupils are equal, round, and reactive to light.  Neck: Normal range of motion.  Cardiovascular: Normal rate.   Pulmonary/Chest: Effort normal.  Tender bilat clavicles,  No deformity  Abdominal: Soft.  Neurological: She is alert.  Skin: Skin is warm.  Abrasion right lower leg,  Swelling left knee,  Healed knee replacemtn incision  Nursing note and vitals reviewed.   ED Course  Procedures (including critical care time) Labs Review Labs Reviewed - No data to display  Imaging Review Dg Chest 1 View  05/08/2015  CLINICAL DATA:  Fall from left with upper chest pain, initial encounter EXAM: CHEST 1 VIEW COMPARISON:  07/22/2011 FINDINGS: Cardiac shadow is within normal limits. Diffuse fibrotic interstitial changes are noted slightly worsened in the interval from the prior exam. No focal confluent infiltrate is seen. No acute bony abnormality is noted. IMPRESSION: Chronic changes without acute abnormality. Electronically Signed   By: Alcide Clever M.D.   On: 05/08/2015 18:22   Dg Pelvis 1-2 Views  05/08/2015  CLINICAL DATA:  Status post fall 1 hour ago. Pelvic pain. Initial encounter. EXAM: PELVIS - 1-2 VIEW COMPARISON:  None. FINDINGS: Bones are osteopenic. No acute bony or joint abnormality is seen. Atherosclerosis is noted. IMPRESSION: No acute  abnormality. Osteopenia. Electronically Signed   By: Drusilla Kanner M.D.   On: 05/08/2015 18:17   Ct Head Wo Contrast  05/08/2015  CLINICAL DATA:  Fall from [employer] lift with head injury, initial encounter EXAM: CT HEAD WITHOUT CONTRAST CT CERVICAL SPINE WITHOUT CONTRAST TECHNIQUE: Multidetector CT imaging of the head and cervical spine was performed following the standard protocol without intravenous contrast. Multiplanar CT image reconstructions of the cervical spine were also generated. COMPARISON:  None. FINDINGS: CT HEAD FINDINGS Bony calvarium is intact. Atrophic changes and chronic white matter ischemic changes noted. No findings to suggest acute hemorrhage, acute infarction or space-occupying mass lesion are noted. CT CERVICAL SPINE  FINDINGS Seven cervical segments are well visualized. Vertebral body height is well maintained. Disc space narrowing is noted at C5-6 and C6-7. Mild facet hypertrophic changes are noted. No acute fracture or acute facet abnormality is noted. No soft tissue abnormality is noted. IMPRESSION: CT of the head:  No acute intracranial abnormality noted. CT of the cervical spine: Degenerative changes without acute abnormality. Chronic atrophic and ischemic changes. Electronically Signed   By: Alcide Clever M.D.   On: 05/08/2015 18:49   Ct Cervical Spine Wo Contrast  05/08/2015  CLINICAL DATA:  Fall from [employer] lift with head injury, initial encounter EXAM: CT HEAD WITHOUT CONTRAST CT CERVICAL SPINE WITHOUT CONTRAST TECHNIQUE: Multidetector CT imaging of the head and cervical spine was performed following the standard protocol without intravenous contrast. Multiplanar CT image reconstructions of the cervical spine were also generated. COMPARISON:  None. FINDINGS: CT HEAD FINDINGS Bony calvarium is intact. Atrophic changes and chronic white matter ischemic changes noted. No findings to suggest acute hemorrhage, acute infarction or space-occupying mass lesion are noted. CT CERVICAL  SPINE FINDINGS Seven cervical segments are well visualized. Vertebral body height is well maintained. Disc space narrowing is noted at C5-6 and C6-7. Mild facet hypertrophic changes are noted. No acute fracture or acute facet abnormality is noted. No soft tissue abnormality is noted. IMPRESSION: CT of the head:  No acute intracranial abnormality noted. CT of the cervical spine: Degenerative changes without acute abnormality. Chronic atrophic and ischemic changes. Electronically Signed   By: Alcide Clever M.D.   On: 05/08/2015 18:49   Dg Knee Complete 4 Views Left  05/08/2015  CLINICAL DATA:  Status post fall 1 hour ago. Left knee pain. History of prior left knee replacement. Initial encounter. EXAM: LEFT KNEE - COMPLETE 4+ VIEW COMPARISON:  None. FINDINGS: No acute bony or joint abnormality is identified. Left knee replacement is in place. No hardware complication is identified. Bones are osteopenic. IMPRESSION: No acute abnormality. Electronically Signed   By: Drusilla Kanner M.D.   On: 05/08/2015 18:18   I have personally reviewed and evaluated these images and lab results as part of my medical decision-making.   EKG Interpretation None      MDM   Final diagnoses:  Contusion of head, initial encounter  Abrasion, right lower leg, sequela    Tylenol for pain Ice to area of swelling Wound care   Elson Areas, PA-C 05/08/15 2007  Laurence Spates, MD 05/10/15 [phone]

## 2015-05-08 NOTE — ED Notes (Signed)
PT fell out of a Nurse, adult at [employer] 1 hour ago. PT struck the back of her head on a cabinet. EMS notes swelling over left clavicle and hematoma on posterior scalp. EMS reports PT is sensitive to touch all over and reacts as though she is in pain to slight touch. PT is at baseline cognition per facility. PT did not lose consciousness.

## 2015-05-08 NOTE — ED Notes (Signed)
Patient transported to X-ray 

## 2015-12-29 ENCOUNTER — Other Ambulatory Visit: Payer: Self-pay | Admitting: Orthopedic Surgery

## 2016-01-05 ENCOUNTER — Encounter (HOSPITAL_COMMUNITY): Payer: Self-pay | Admitting: *Deleted

## 2016-01-05 NOTE — Progress Notes (Signed)
SDW-pre-op call completed by Wyatt Mageabitha, LPN at [facility]. Tabitha, LPN, stated that pt must take medications with Applesauce or Pudding; nurse advised to hold AM medications. Nurse stated that pt is not currently being treated for diabetes. Nurse stated that pt last A1c was June 2017. Nurse made aware to stop administering vitamins, fish oil  and herbal medications. Do not give any NSAIDs ie: Ibuprofen, Advil, Naproxen, BC and Goody Powder. Nurse verbalized understanding of all pre-op instructions.

## 2016-01-06 ENCOUNTER — Encounter (HOSPITAL_COMMUNITY): Payer: Self-pay | Admitting: Certified Registered"

## 2016-01-06 ENCOUNTER — Ambulatory Visit (HOSPITAL_COMMUNITY)
Admission: RE | Admit: 2016-01-06 | Discharge: 2016-01-06 | Disposition: A | Discharge status: Skilled Nursing Facility | Payer: Medicare Other | Source: Ambulatory Visit | Attending: Orthopedic Surgery | Admitting: Orthopedic Surgery

## 2016-01-06 ENCOUNTER — Ambulatory Visit (HOSPITAL_COMMUNITY): Payer: Medicare Other | Admitting: Anesthesiology

## 2016-01-06 ENCOUNTER — Encounter (HOSPITAL_COMMUNITY)
Admission: RE | Disposition: A | Discharge status: Skilled Nursing Facility | Payer: Self-pay | Source: Ambulatory Visit | Attending: Orthopedic Surgery

## 2016-01-06 DIAGNOSIS — Z7984 Long term (current) use of oral hypoglycemic drugs: Secondary | ICD-10-CM | POA: Insufficient documentation

## 2016-01-06 DIAGNOSIS — E1140 Type 2 diabetes mellitus with diabetic neuropathy, unspecified: Secondary | ICD-10-CM | POA: Diagnosis not present

## 2016-01-06 DIAGNOSIS — M869 Osteomyelitis, unspecified: Secondary | ICD-10-CM | POA: Diagnosis present

## 2016-01-06 DIAGNOSIS — I10 Essential (primary) hypertension: Secondary | ICD-10-CM | POA: Insufficient documentation

## 2016-01-06 DIAGNOSIS — Z89421 Acquired absence of other right toe(s): Secondary | ICD-10-CM | POA: Diagnosis not present

## 2016-01-06 DIAGNOSIS — Z96659 Presence of unspecified artificial knee joint: Secondary | ICD-10-CM | POA: Diagnosis not present

## 2016-01-06 DIAGNOSIS — Z7982 Long term (current) use of aspirin: Secondary | ICD-10-CM | POA: Insufficient documentation

## 2016-01-06 DIAGNOSIS — E785 Hyperlipidemia, unspecified: Secondary | ICD-10-CM | POA: Diagnosis not present

## 2016-01-06 DIAGNOSIS — Z89411 Acquired absence of right great toe: Secondary | ICD-10-CM | POA: Diagnosis not present

## 2016-01-06 DIAGNOSIS — F0390 Unspecified dementia without behavioral disturbance: Secondary | ICD-10-CM | POA: Diagnosis not present

## 2016-01-06 DIAGNOSIS — Z87891 Personal history of nicotine dependence: Secondary | ICD-10-CM | POA: Insufficient documentation

## 2016-01-06 DIAGNOSIS — L089 Local infection of the skin and subcutaneous tissue, unspecified: Secondary | ICD-10-CM | POA: Insufficient documentation

## 2016-01-06 DIAGNOSIS — Z79899 Other long term (current) drug therapy: Secondary | ICD-10-CM | POA: Insufficient documentation

## 2016-01-06 DIAGNOSIS — E1151 Type 2 diabetes mellitus with diabetic peripheral angiopathy without gangrene: Secondary | ICD-10-CM | POA: Diagnosis not present

## 2016-01-06 HISTORY — DX: Dysphagia, unspecified: R13.10

## 2016-01-06 HISTORY — PX: AMPUTATION TOE: SHX6595

## 2016-01-06 HISTORY — DX: Type 2 diabetes mellitus with diabetic neuropathy, unspecified: E11.40

## 2016-01-06 HISTORY — DX: Unspecified dementia, unspecified severity, without behavioral disturbance, psychotic disturbance, mood disturbance, and anxiety: F03.90

## 2016-01-06 HISTORY — DX: Other allergic rhinitis: J30.89

## 2016-01-06 LAB — BASIC METABOLIC PANEL
ANION GAP: 8 (ref 5–15)
BUN: 15 mg/dL (ref 6–20)
CO2: 25 mmol/L (ref 22–32)
Calcium: 9.3 mg/dL (ref 8.9–10.3)
Chloride: 105 mmol/L (ref 101–111)
Creatinine, Ser: 0.78 mg/dL (ref 0.44–1.00)
GFR calc Af Amer: >60 mL/min (ref >60)
GLUCOSE: 123 mg/dL — AB (ref 65–99)
POTASSIUM: 4.3 mmol/L (ref 3.5–5.1)
Sodium: 138 mmol/L (ref 135–145)

## 2016-01-06 LAB — CBC
HEMATOCRIT: 43.6 % (ref 36.0–46.0)
HEMOGLOBIN: 13.9 g/dL (ref 12.0–15.0)
MCH: 29.4 pg (ref 26.0–34.0)
MCHC: 31.9 g/dL (ref 30.0–36.0)
MCV: 92.4 fL (ref 78.0–100.0)
Platelets: 262 10*3/uL (ref 150–400)
RBC: 4.72 MIL/uL (ref 3.87–5.11)
RDW: 14.3 % (ref 11.5–15.5)
WBC: 6.4 10*3/uL (ref 4.0–10.5)

## 2016-01-06 LAB — GLUCOSE, CAPILLARY
Glucose-Capillary: 96 mg/dL (ref 65–99)
Glucose-Capillary: 119 mg/dL — ABNORMAL HIGH (ref 65–99)

## 2016-01-06 SURGERY — AMPUTATION, TOE
Anesthesia: Monitor Anesthesia Care | Site: Foot | Laterality: Left

## 2016-01-06 MED ORDER — FENTANYL CITRATE (PF) 100 MCG/2ML IJ SOLN
INTRAMUSCULAR | Status: AC
  Filled 2016-01-06: qty 2

## 2016-01-06 MED ORDER — FENTANYL CITRATE (PF) 100 MCG/2ML IJ SOLN
INTRAMUSCULAR | Status: DC | PRN
  Administered 2016-01-06: 50 ug via INTRAVENOUS

## 2016-01-06 MED ORDER — MIDAZOLAM HCL 5 MG/5ML IJ SOLN
INTRAMUSCULAR | Status: DC | PRN
  Administered 2016-01-06: 1 mg via INTRAVENOUS

## 2016-01-06 MED ORDER — CLINDAMYCIN PHOSPHATE 300 MG/50ML IV SOLN
300.0000 mg | Freq: Once | INTRAVENOUS | Status: AC
  Administered 2016-01-06: 300 mg via INTRAVENOUS
  Filled 2016-01-06: qty 50

## 2016-01-06 MED ORDER — CHLORHEXIDINE GLUCONATE 4 % EX LIQD: 60.0000 mL | Freq: Once | CUTANEOUS | Status: DC

## 2016-01-06 MED ORDER — LIDOCAINE-EPINEPHRINE (PF) 1.5 %-1:200000 IJ SOLN
INTRAMUSCULAR | Status: DC | PRN
  Administered 2016-01-06: 15 mL

## 2016-01-06 MED ORDER — 0.9 % SODIUM CHLORIDE (POUR BTL) OPTIME
TOPICAL | Status: DC | PRN
  Administered 2016-01-06: 1000 mL

## 2016-01-06 MED ORDER — FENTANYL CITRATE (PF) 100 MCG/2ML IJ SOLN: 25.0000 ug | INTRAMUSCULAR | Status: DC | PRN

## 2016-01-06 MED ORDER — LACTATED RINGERS IV SOLN
INTRAVENOUS | Status: DC
  Administered 2016-01-06: 11:00:00 via INTRAVENOUS

## 2016-01-06 MED ORDER — CLINDAMYCIN PHOSPHATE 900 MG/50ML IV SOLN
900.0000 mg | INTRAVENOUS | Status: AC
  Administered 2016-01-06: 900 mg via INTRAVENOUS
  Filled 2016-01-06: qty 50

## 2016-01-06 MED ORDER — MIDAZOLAM HCL 2 MG/2ML IJ SOLN
INTRAMUSCULAR | Status: AC
  Filled 2016-01-06: qty 2

## 2016-01-06 SURGICAL SUPPLY — 47 items
BANDAGE ACE 4X5 VEL STRL LF (GAUZE/BANDAGES/DRESSINGS) IMPLANT
BANDAGE ELASTIC 3 VELCRO ST LF (GAUZE/BANDAGES/DRESSINGS) IMPLANT
BANDAGE ELASTIC 4 VELCRO ST LF (GAUZE/BANDAGES/DRESSINGS) ×6 IMPLANT
BLADE SAW SGTL 18.5X63.X.64 HD (BLADE) ×3 IMPLANT
BNDG COHESIVE 1X5 TAN STRL LF (GAUZE/BANDAGES/DRESSINGS) IMPLANT
BNDG CONFORM 2 STRL LF (GAUZE/BANDAGES/DRESSINGS) IMPLANT
BNDG ESMARK 4X9 LF (GAUZE/BANDAGES/DRESSINGS) ×3 IMPLANT
BNDG GAUZE ELAST 4 BULKY (GAUZE/BANDAGES/DRESSINGS) ×3 IMPLANT
COVER SURGICAL LIGHT HANDLE (MISCELLANEOUS) ×3 IMPLANT
CUFF TOURNIQUET SINGLE 18IN (TOURNIQUET CUFF) ×3 IMPLANT
DRAPE U-SHAPE 47X51 STRL (DRAPES) ×3 IMPLANT
DRSG ADAPTIC 3X8 NADH LF (GAUZE/BANDAGES/DRESSINGS) IMPLANT
DRSG PAD ABDOMINAL 8X10 ST (GAUZE/BANDAGES/DRESSINGS) ×6 IMPLANT
GAUZE SPONGE 4X4 12PLY STRL (GAUZE/BANDAGES/DRESSINGS) IMPLANT
GAUZE XEROFORM 5X9 LF (GAUZE/BANDAGES/DRESSINGS) ×3 IMPLANT
GLOVE BIOGEL PI IND STRL 8 (GLOVE) ×2 IMPLANT
GLOVE BIOGEL PI INDICATOR 8 (GLOVE) ×4
GLOVE ECLIPSE 7.5 STRL STRAW (GLOVE) ×6 IMPLANT
GOWN STRL REUS W/ TWL LRG LVL3 (GOWN DISPOSABLE) ×1 IMPLANT
GOWN STRL REUS W/ TWL XL LVL3 (GOWN DISPOSABLE) ×2 IMPLANT
GOWN STRL REUS W/TWL LRG LVL3 (GOWN DISPOSABLE) ×2
GOWN STRL REUS W/TWL XL LVL3 (GOWN DISPOSABLE) ×4
KIT BASIN OR (CUSTOM PROCEDURE TRAY) ×3 IMPLANT
KIT ROOM TURNOVER OR (KITS) ×3 IMPLANT
MANIFOLD NEPTUNE II (INSTRUMENTS) ×3 IMPLANT
NS IRRIG 1000ML POUR BTL (IV SOLUTION) ×3 IMPLANT
PACK ORTHO EXTREMITY (CUSTOM PROCEDURE TRAY) ×3 IMPLANT
PAD ARMBOARD 7.5X6 YLW CONV (MISCELLANEOUS) ×6 IMPLANT
PAD CAST 4YDX4 CTTN HI CHSV (CAST SUPPLIES) IMPLANT
PADDING CAST COTTON 4X4 STRL (CAST SUPPLIES)
SOLUTION BETADINE 4OZ (MISCELLANEOUS) ×3 IMPLANT
SPECIMEN JAR SMALL (MISCELLANEOUS) ×3 IMPLANT
SPONGE GAUZE 4X4 12PLY STER LF (GAUZE/BANDAGES/DRESSINGS) ×3 IMPLANT
SPONGE SCRUB IODOPHOR (GAUZE/BANDAGES/DRESSINGS) ×3 IMPLANT
STAPLER VISISTAT 35W (STAPLE) ×3 IMPLANT
SUCTION FRAZIER HANDLE 10FR (MISCELLANEOUS) ×2
SUCTION TUBE FRAZIER 10FR DISP (MISCELLANEOUS) ×1 IMPLANT
SUT ETHILON 3 0 PS 1 (SUTURE) ×6 IMPLANT
SUT ETHILON 4 0 PS 2 18 (SUTURE) ×3 IMPLANT
SUT VIC AB 2-0 CT1 27 (SUTURE) ×4
SUT VIC AB 2-0 CT1 TAPERPNT 27 (SUTURE) ×2 IMPLANT
TOWEL OR 17X24 6PK STRL BLUE (TOWEL DISPOSABLE) ×3 IMPLANT
TOWEL OR 17X26 10 PK STRL BLUE (TOWEL DISPOSABLE) ×3 IMPLANT
TUBE CONNECTING 12'X1/4 (SUCTIONS) ×1
TUBE CONNECTING 12X1/4 (SUCTIONS) ×2 IMPLANT
UNDERPAD 30X30 (UNDERPADS AND DIAPERS) ×3 IMPLANT
WATER STERILE IRR 1000ML POUR (IV SOLUTION) ×3 IMPLANT

## 2016-01-06 NOTE — Progress Notes (Signed)
Report called to [employer] rn at [facility]. [facility] called and notified of transport need. Pt to be discharged upon [facility] arrival. Daughter at bedside in PACU with patient.

## 2016-01-06 NOTE — Discharge Instructions (Signed)
Elevate left foot as much as possible. Keep dry.

## 2016-01-06 NOTE — Anesthesia Postprocedure Evaluation (Signed)
Anesthesia Post Note  Patient: Natasha West  Procedure(s) Performed: Procedure(s) (LRB): LEFT TRANSMETATARSAL AMPUTATION (Left)  Patient location during evaluation: PACU Anesthesia Type: MAC Level of consciousness: awake and alert Pain management: pain level controlled Vital Signs Assessment: post-procedure vital signs reviewed and stable Respiratory status: spontaneous breathing, nonlabored ventilation, respiratory function stable and patient connected to nasal cannula oxygen Cardiovascular status: stable and blood pressure returned to baseline Anesthetic complications: no    Last Vitals:  Vitals:   01/06/16 1416 01/06/16 1455  BP: 103/67 103/78  Pulse: (!) 51 (!) 52  Resp: 14 14  Temp: 36.6 C     Last Pain:  Vitals:   01/06/16 1455  TempSrc:   PainSc: 0-No pain                 Cassity Christian,JAMES TERRILL

## 2016-01-06 NOTE — Anesthesia Preprocedure Evaluation (Addendum)
Anesthesia Evaluation  Patient identified by MRN, date of birth, ID band Patient confused and Patient unresponsive  General Assessment Comment:Pt has dementia  Reviewed: Allergy & Precautions, NPO status , Patient's Chart, lab work & pertinent test results, Unable to perform ROS - Chart review only  Airway Mallampati: II  TM Distance: >3 FB Neck ROM: Full    Dental  (+) Edentulous Upper   Pulmonary former smoker,    breath sounds clear to auscultation       Cardiovascular hypertension, + Peripheral Vascular Disease   Rhythm:Regular Rate:Normal     Neuro/Psych  Neuromuscular disease    GI/Hepatic negative GI ROS, Neg liver ROS,   Endo/Other  diabetes  Renal/GU      Musculoskeletal  (+) Arthritis ,   Abdominal   Peds  Hematology  (+) anemia ,   Anesthesia Other Findings   Reproductive/Obstetrics                            Anesthesia Physical Anesthesia Plan  ASA: III  Anesthesia Plan: MAC   Post-op Pain Management:    Induction:   Airway Management Planned: Natural Airway  Additional Equipment:   Intra-op Plan:   Post-operative Plan:   Informed Consent: I have reviewed the patients History and Physical, chart, labs and discussed the procedure including the risks, benefits and alternatives for the proposed anesthesia with the patient or authorized representative who has indicated his/her understanding and acceptance.   Dental advisory given  Plan Discussed with: CRNA  Anesthesia Plan Comments: (discussed c patients daughter )        Anesthesia Quick Evaluation

## 2016-01-06 NOTE — H&P (Signed)
PREOPERATIVE H&P  Chief Complaint: left foot infection   HPI: Natasha West is a 82 y.o. female who presents for evaluation of l foot infection. It has been present for several months and has been worsening. She has failed conservative measures. Pain is rated as mild.  Past Medical History:  Diagnosis Date  . Dementia   . Diabetes mellitus   . Diabetic neuropathy (HCC)   . Dysphagia   . Environmental and seasonal allergies   . Foot infection    with sepsis  and amputation toes right  . Hyperlipidemia   . Hypertension   . Lower limb amputation, great toe (HCC) 06/30/2008   Qualifier: Diagnosis of  By: Fabian Sharp MD, Neta Mends   . LOWER LIMB AMPUTATION, OTHER TOE 05/07/2008   Qualifier: Diagnosis of  By: Fabian Sharp MD, Neta Mends   . Memory difficulty   . Open wound of right foot 09/09/2011  . Peripheral neuropathy (HCC)    partial amputation rt foot   Past Surgical History:  Procedure Laterality Date  . AMPUTATION  2009   from gangrene rt 4th toe Regal  . AMPUTATION  03 15 2010   Rt great toe and rt distal phalanx  . AMPUTATION  09/09/2011   Procedure: AMPUTATION DIGIT;  Surgeon: Harvie Junior, MD;  Location: [facility];  Service: Orthopedics;  Laterality: Right;  revision amputation midfoot  . APPENDECTOMY    . ELBOW FRACTURE SURGERY     left with orif  . Right amputation  7 12    with sepsis  . TOE AMPUTATION  1 12   right 3rd for ulcer  Harvie Morua   . TOTAL KNEE ARTHROPLASTY     Social History   Social History  . Marital status: Married    Spouse name: N/A  . Number of children: N/A  . Years of education: N/A   Social History Main Topics  . Smoking status: Former Smoker    Quit date: 09/07/1991  . Smokeless tobacco: Never Used  . Alcohol use No  . Drug use: No  . Sexual activity: Not Asked   Other Topics Concern  . None   Social History Narrative   Married    Husband has vision problem and recurrent falls   Moved to retirement place   [place]  2010 closer to family   G4P[place]   Currently back to [place] after [employer] is prepared for them. 2 daughters checking on them   1 help her who comes in to help bathe and dress patient   Family History  Problem Relation Age of Onset  . Alzheimer's disease Mother   . Other Father     cva endarterectomy  . Diabetes type II Daughter    Allergies  Allergen Reactions  . Penicillins Other (See Comments)    Has patient had a PCN reaction causing immediate rash, facial/tongue/throat swelling, SOB or lightheadedness with hypotension: unknown Has patient had a PCN reaction causing severe rash involving mucus membranes or skin necrosis: unknown Has patient had a PCN reaction that required hospitalization: unknown Has patient had a PCN reaction occurring within the last 10 years: No If all of the above answers are "NO", then may proceed with Cephalosporin use.    Prior to Admission medications   Medication Sig Start Date End Date Taking? Authorizing Provider  aspirin 81 MG chewable tablet Chew 81 mg by mouth daily.     Yes Historical Provider, MD  bisacodyl (DULCOLAX) 10 MG suppository Place 10 mg rectally every Monday, Wednesday, and Friday.   Yes Historical Provider, MD  donepezil (ARICEPT ODT) 10 MG disintegrating tablet Take 1 tablet (10 mg total) by mouth at bedtime. Patient taking differently: Take 10 mg by mouth at bedtime. 7 pm 06/07/13  Yes Madelin HeadingsWanda K Panosh, MD  gabapentin (NEURONTIN) 100 MG capsule Take 100 mg by mouth 3 (three) times daily. 8am, 1pm, 7pm   Yes Historical Provider, MD  HYDROcodone-acetaminophen (NORCO/VICODIN) 5-325 MG tablet Take 0.5 tablets by mouth every 6 (six) hours as needed (chronic pain due to trauma).   Yes Historical Provider, MD  loperamide (IMODIUM A-D) 2 MG tablet Take 2 mg by mouth 4 (four) times daily as needed for diarrhea or loose stools.   Yes Historical Provider, MD  loratadine (CLARITIN) 10 MG tablet Take 10 mg by mouth daily.   Yes  Historical Provider, MD  losartan (COZAAR) 25 MG tablet Take 25 mg by mouth daily.   Yes Historical Provider, MD  Nutritional Supplements (NUTRITIONAL SUPPLEMENT PO) Take by mouth 3 (three) times daily between meals. Magic Cup nutritional supplement   Yes Historical Provider, MD  Nutritional Supplements (NUTRITIONAL SUPPLEMENT PO) Take 120 mLs by mouth at bedtime. Med Pass   Yes Historical Provider, MD  senna (SENOKOT) 8.6 MG TABS tablet Take 1-2 tablets by mouth See admin instructions. Take 2 tablets (17.2 mg) by mouth daily at bedtime, may also take 1 tablet (8.6 mg) daily as needed for constipation   Yes Historical Provider, MD  acetaminophen (TYLENOL) 500 MG tablet Take 500 mg by mouth every 4 (four) hours as needed for fever.    Historical Provider, MD  atorvastatin (LIPITOR) 10 MG tablet Take 1 tablet (10 mg total) by mouth daily. Patient not taking: Reported on 01/04/2016 04/26/13   Madelin HeadingsWanda K Panosh, MD  dextromethorphan (DELSYM) 30 MG/5ML liquid Take 60 mg by mouth 2 (two) times daily as needed for cough.    Historical Provider, MD  Dextromethorphan-Guaifenesin (GERI-TUSSIN DM) 10-100 MG/5ML liquid Take 15 mLs by mouth every 4 (four) hours as needed (cough).    Historical Provider, MD  lisinopril (PRINIVIL,ZESTRIL) 5 MG tablet Take 1 tablet (5 mg total) by mouth daily. Patient not taking: Reported on 01/04/2016 04/26/13   Madelin HeadingsWanda K Panosh, MD  metFORMIN (GLUCOPHAGE-XR) 500 MG 24 hr tablet Take 1 tablet (500 mg total) by mouth 2 (two) times daily. Patient not taking: Reported on 01/04/2016 04/26/13   Madelin HeadingsWanda K Panosh, MD     Positive ROS: none  All other systems have been reviewed and were otherwise negative with the exception of those mentioned in the HPI and as above.  Physical Exam: Vitals:   01/06/16 1056  BP: (!) 163/95  Pulse: 86  Resp: 14  Temp: 97.6 F (36.4 C)    General: Alert, no acute distress Cardiovascular: No pedal edema Respiratory: No cyanosis, no use of accessory  musculature GI: No organomegaly, abdomen is soft and non-tender Skin: No lesions in the area of chief complaint Neurologic: Sensation intact distally Psychiatric: Patient is competent for consent with normal mood and affect Lymphatic: No axillary or cervical lymphadenopathy  MUSCULOSKELETAL: l foot: open wound over 2nd toe with mild drainage and foull smell.  Assessment/Plan: INFECTED TOE with history of neuropathy and r foot with three different surgeries because of infected toes. Plan for Procedure(s): TRANSMETATARSAL AMPUTATION to avoid future need for surgery  The risks benefits and alternatives were discussed with the patient including but not  limited to the risks of nonoperative treatment, versus surgical intervention including infection, bleeding, nerve injury, malunion, nonunion, hardware prominence, hardware failure, need for hardware removal, blood clots, cardiopulmonary complications, morbidity, mortality, among others, and they were willing to proceed.  Predicted outcome is good, although there will be at least a six to nine month expected recovery.  Harvie Junior, MD 01/06/2016 12:49 PM

## 2016-01-06 NOTE — Anesthesia Procedure Notes (Signed)
Procedure Name: MAC Date/Time: 01/06/2016 1:00 PM Performed by: Charm BargesBUTLER, Jamesrobert Ohanesian R Pre-anesthesia Checklist: Patient identified, Emergency Drugs available, Suction available, Patient being monitored and Timeout performed Oxygen Delivery Method: Simple face mask Placement Confirmation: positive ETCO2 Dental Injury: Teeth and Oropharynx as per pre-operative assessment

## 2016-01-06 NOTE — Transfer of Care (Signed)
Immediate Anesthesia Transfer of Care Note  Patient: Natasha West  Procedure(s) Performed: Procedure(s): LEFT TRANSMETATARSAL AMPUTATION (Left)  Patient Location: PACU  Anesthesia Type:MAC  Level of Consciousness: awake and patient cooperative  Airway & Oxygen Therapy: Patient Spontanous Breathing and Patient connected to face mask oxygen  Post-op Assessment: Report given to RN, Post -op Vital signs reviewed and stable and Patient moving all extremities  Post vital signs: Reviewed and stable  Last Vitals:  Vitals:   01/06/16 1056  BP: (!) 163/95  Pulse: 86  Resp: 14  Temp: 36.4 C    Last Pain:  Vitals:   01/06/16 1056  TempSrc: Oral         Complications: No apparent anesthesia complications

## 2016-01-06 NOTE — Anesthesia Procedure Notes (Signed)
Anesthesia Regional Block:  Ankle block  Pre-Anesthetic Checklist: ,, timeout performed, Correct Patient, Correct Site, Correct Procedure, Correct Position, site marked, risks and benefits discussed, surgical consent, pre-op evaluation, At surgeon's request  Laterality: Left and Lower  Prep: chloraprep       Ankle block Narrative:  Start time: 01/06/2016 11:35 AM End time: 01/06/2016 11:48 AM Injection made incrementally with aspirations every 5 mL.  Performed by: Personally  Anesthesiologist: Corry Ihnen  Additional Notes: Ankle block tibial n and periankle infiltration, 18 cc Xylocaine

## 2016-01-06 NOTE — Progress Notes (Signed)
Report given to caroline rn as caregiver 

## 2016-01-06 NOTE — Brief Op Note (Signed)
01/06/2016  1:28 PM  PATIENT:  Natasha West  82 y.o. female  PRE-OPERATIVE DIAGNOSIS:  INFECTED TOE  POST-OPERATIVE DIAGNOSIS:  INFECTED TOE  PROCEDURE:  Procedure(s): LEFT TRANSMETATARSAL AMPUTATION (Left)  SURGEON:  Surgeon(s) and Role:    * Jodi GeraldsJohn Kadian Barcellos, MD - Primary  PHYSICIAN ASSISTANT:   ASSISTANTS: bethune   ANESTHESIA:   general  EBL:  No intake/output data recorded.  BLOOD ADMINISTERED:none  DRAINS: none   LOCAL MEDICATIONS USED:  NONE  SPECIMEN:  No Specimen  DISPOSITION OF SPECIMEN:  N/A  COUNTS:  YES  TOURNIQUET:   Total Tourniquet Time Documented: Calf (Left) - 6 minutes Total: Calf (Left) - 6 minutes   DICTATION: .Other Dictation: Dictation Number none given  PLAN OF CARE: Discharge to home after PACU  PATIENT DISPOSITION:  PACU - hemodynamically stable.   Delay start of Pharmacological VTE agent (>24hrs) due to surgical blood loss or risk of bleeding: no

## 2016-01-07 ENCOUNTER — Encounter (HOSPITAL_COMMUNITY): Payer: Self-pay | Admitting: Orthopedic Surgery

## 2016-01-07 NOTE — Op Note (Signed)
NAMOlga West:  Greenhouse, Sharelle               ACCOUNT NO.:  [id number]  MEDICAL RECORD NO.:  [id number]  LOCATION:  [facility]                         FACILITY:  [facility]  PHYSICIAN:  Harvie JuniorJohn L. Rhylin Venters, M.D.   DATE OF BIRTH:  08-24-1927  DATE OF PROCEDURE:  01/06/2016 DATE OF DISCHARGE:  01/06/2016                              OPERATIVE REPORT   She is an 82 year old female on the Orthopedic Surgery Service.  PREOPERATIVE DIAGNOSIS:  Infected second toe in the setting of dense neuropathy in the foot.  POSTOPERATIVE DIAGNOSIS:  Infected second toe in the setting of dense neuropathy in the foot.  PROCEDURE:  Transmetatarsal amputation, left foot.  SURGEON:  Harvie JuniorJohn L. Quinzell Malcomb, M.D.  ASSISTANT:  Marshia LyJames Bethune, PA  ANESTHESIA:  General.  BRIEF HISTORY:  Ms. Rennis Hardingllis is an 82 year old female with a long history of significant complaints of infected left second toe.  She is a non- ambulator and lives in a nursing home.  We have treated her with antibiotics and dressing changes for several months and has had minimal success in getting this to heal.  We treated the right foot, had done serial surgeries one toe at a time after infection with crop up and after discussion with the family as the patient is a non-ambulator, it was their was desire to go ahead to do a transmetatarsal amputation, which I thought was reasonable given her nonambulatory status.  She was brought to the operating room for this procedure.  DESCRIPTION OF PROCEDURE:  The patient was brought to the operative room.  After adequate anesthesia was obtained with an ankle block, the patient was placed supine on the operating table.  Left leg was prepped and draped in usual sterile fashion.  Following this, an incision was made for transmetatarsal amputation with a long posterior flap.  We made the anterior incision and followed the posterior incision in the flap and then took a saw and resected each of the metatarsals in a cascading fashion  laterally.  Once this was done, these toes were all removed and at this point, the tourniquet was let down.  All bleeding was controlled with electrocautery and the flap was closed with a combination of 2-0 Vicryl and 3-0 nylon and staples.  Sterile compressive dressing was applied and the patient was taken to the recovery room and was noted to be in a satisfactory condition. Estimated blood loss for the procedure was minimal.     Harvie JuniorJohn L. Senai Ramnath, M.D.     Ranae PlumberJLG/MEDQ  D:  01/06/2016  T:  01/07/2016  Job:  [id number]

## 2017-09-09 DEATH — deceased
